# Patient Record
Sex: Male | Born: 1970 | Race: White | Hispanic: No | Marital: Married | Smoking: Current every day smoker
Health system: Southern US, Community
[De-identification: ages and names within clinical notes are randomized; demographics above are authoritative.]

## PROBLEM LIST (undated history)

## (undated) DIAGNOSIS — F32A Depression, unspecified: Secondary | ICD-10-CM

## (undated) DIAGNOSIS — F329 Major depressive disorder, single episode, unspecified: Secondary | ICD-10-CM

## (undated) DIAGNOSIS — I10 Essential (primary) hypertension: Secondary | ICD-10-CM

## (undated) DIAGNOSIS — F1910 Other psychoactive substance abuse, uncomplicated: Secondary | ICD-10-CM

## (undated) DIAGNOSIS — F419 Anxiety disorder, unspecified: Secondary | ICD-10-CM

## (undated) DIAGNOSIS — K7010 Alcoholic hepatitis without ascites: Secondary | ICD-10-CM

## (undated) DIAGNOSIS — E785 Hyperlipidemia, unspecified: Secondary | ICD-10-CM

## (undated) HISTORY — DX: Alcoholic hepatitis without ascites: K70.10

## (undated) HISTORY — DX: Depression, unspecified: F32.A

## (undated) HISTORY — DX: Hyperlipidemia, unspecified: E78.5

## (undated) HISTORY — DX: Other psychoactive substance abuse, uncomplicated: F19.10

## (undated) HISTORY — PX: OTHER SURGICAL HISTORY: SHX169

## (undated) HISTORY — PX: BACK SURGERY: SHX140

## (undated) HISTORY — DX: Major depressive disorder, single episode, unspecified: F32.9

## (undated) HISTORY — DX: Anxiety disorder, unspecified: F41.9

## (undated) HISTORY — PX: TONSILLECTOMY: SUR1361

---

## 2008-03-24 ENCOUNTER — Emergency Department (HOSPITAL_BASED_OUTPATIENT_CLINIC_OR_DEPARTMENT_OTHER): Admission: EM | Admit: 2008-03-24 | Discharge: 2008-03-24 | Payer: Self-pay | Admitting: Emergency Medicine

## 2008-07-24 ENCOUNTER — Encounter (INDEPENDENT_AMBULATORY_CARE_PROVIDER_SITE_OTHER): Payer: Self-pay | Admitting: *Deleted

## 2008-10-10 ENCOUNTER — Ambulatory Visit: Payer: Self-pay | Admitting: Internal Medicine

## 2008-10-10 DIAGNOSIS — F411 Generalized anxiety disorder: Secondary | ICD-10-CM | POA: Insufficient documentation

## 2008-10-10 DIAGNOSIS — F329 Major depressive disorder, single episode, unspecified: Secondary | ICD-10-CM | POA: Insufficient documentation

## 2008-10-10 DIAGNOSIS — F3289 Other specified depressive episodes: Secondary | ICD-10-CM | POA: Insufficient documentation

## 2008-10-14 ENCOUNTER — Telehealth: Payer: Self-pay | Admitting: Family Medicine

## 2008-10-16 ENCOUNTER — Encounter (INDEPENDENT_AMBULATORY_CARE_PROVIDER_SITE_OTHER): Payer: Self-pay | Admitting: *Deleted

## 2008-10-16 ENCOUNTER — Ambulatory Visit: Payer: Self-pay | Admitting: Internal Medicine

## 2008-10-16 ENCOUNTER — Telehealth (INDEPENDENT_AMBULATORY_CARE_PROVIDER_SITE_OTHER): Payer: Self-pay | Admitting: *Deleted

## 2008-10-16 LAB — CONVERTED CEMR LAB
Chloride: 106 meq/L (ref 96–112)
Eosinophils Relative: 3.9 % (ref 0.0–5.0)
GFR calc Af Amer: 140 mL/min
Glucose, Bld: 88 mg/dL (ref 70–99)
Lymphocytes Relative: 22.2 % (ref 12.0–46.0)
Monocytes Absolute: 0.7 10*3/uL (ref 0.1–1.0)
Monocytes Relative: 6.4 % (ref 3.0–12.0)
Neutrophils Relative %: 67.5 % (ref 43.0–77.0)
Platelets: 219 10*3/uL (ref 150–400)
Potassium: 5 meq/L (ref 3.5–5.1)
RDW: 13.2 % (ref 11.5–14.6)
Sodium: 142 meq/L (ref 135–145)
WBC: 10.2 10*3/uL (ref 4.5–10.5)

## 2008-11-13 ENCOUNTER — Telehealth (INDEPENDENT_AMBULATORY_CARE_PROVIDER_SITE_OTHER): Payer: Self-pay | Admitting: *Deleted

## 2008-11-23 ENCOUNTER — Ambulatory Visit: Payer: Self-pay | Admitting: Internal Medicine

## 2008-11-24 ENCOUNTER — Encounter: Payer: Self-pay | Admitting: Internal Medicine

## 2008-11-25 ENCOUNTER — Telehealth: Payer: Self-pay | Admitting: Internal Medicine

## 2008-11-25 LAB — CONVERTED CEMR LAB: Alcohol, Ethyl (B): 93 mg/dL — ABNORMAL HIGH (ref 0–10)

## 2011-07-17 LAB — DIFFERENTIAL
Basophils Absolute: 0.2 — ABNORMAL HIGH
Basophils Relative: 2 — ABNORMAL HIGH
Eosinophils Absolute: 0.1
Eosinophils Relative: 1
Lymphocytes Relative: 15
Lymphs Abs: 2
Monocytes Absolute: 1.2 — ABNORMAL HIGH
Monocytes Relative: 9
Neutro Abs: 9.8 — ABNORMAL HIGH
Neutrophils Relative %: 73

## 2011-07-17 LAB — BASIC METABOLIC PANEL WITH GFR
BUN: 9
CO2: 23
GFR calc non Af Amer: >60
Glucose, Bld: 108 — ABNORMAL HIGH
Potassium: 4.2

## 2011-07-17 LAB — CBC
HCT: 45.4
Hemoglobin: 16
MCHC: 35.2
MCV: 98.6
Platelets: 232
RBC: 4.6
RDW: 13.3
WBC: 13.3 — ABNORMAL HIGH

## 2011-07-17 LAB — BASIC METABOLIC PANEL
Calcium: 9.2
Chloride: 101
Creatinine, Ser: 0.8
GFR calc Af Amer: >60
Sodium: 136

## 2018-02-15 ENCOUNTER — Emergency Department (HOSPITAL_BASED_OUTPATIENT_CLINIC_OR_DEPARTMENT_OTHER)
Admission: EM | Admit: 2018-02-15 | Discharge: 2018-02-15 | Disposition: A | Discharge status: Home / Self Care | Payer: PRIVATE HEALTH INSURANCE | Attending: Emergency Medicine | Admitting: Emergency Medicine

## 2018-02-15 ENCOUNTER — Emergency Department (HOSPITAL_BASED_OUTPATIENT_CLINIC_OR_DEPARTMENT_OTHER): Payer: PRIVATE HEALTH INSURANCE

## 2018-02-15 ENCOUNTER — Other Ambulatory Visit: Payer: Self-pay

## 2018-02-15 ENCOUNTER — Encounter (HOSPITAL_BASED_OUTPATIENT_CLINIC_OR_DEPARTMENT_OTHER): Payer: Self-pay | Admitting: Emergency Medicine

## 2018-02-15 DIAGNOSIS — Y9389 Activity, other specified: Secondary | ICD-10-CM | POA: Insufficient documentation

## 2018-02-15 DIAGNOSIS — Y999 Unspecified external cause status: Secondary | ICD-10-CM | POA: Insufficient documentation

## 2018-02-15 DIAGNOSIS — Y92000 Kitchen of unspecified non-institutional (private) residence as  the place of occurrence of the external cause: Secondary | ICD-10-CM | POA: Insufficient documentation

## 2018-02-15 DIAGNOSIS — W01198A Fall on same level from slipping, tripping and stumbling with subsequent striking against other object, initial encounter: Secondary | ICD-10-CM | POA: Insufficient documentation

## 2018-02-15 DIAGNOSIS — S0101XA Laceration without foreign body of scalp, initial encounter: Secondary | ICD-10-CM | POA: Insufficient documentation

## 2018-02-15 DIAGNOSIS — I10 Essential (primary) hypertension: Secondary | ICD-10-CM | POA: Insufficient documentation

## 2018-02-15 HISTORY — DX: Essential (primary) hypertension: I10

## 2018-02-15 MED ORDER — TETANUS-DIPHTH-ACELL PERTUSSIS 5-2.5-18.5 LF-MCG/0.5 IM SUSP
0.5000 mL | Freq: Once | INTRAMUSCULAR | Status: AC
  Administered 2018-02-15: 0.5 mL via INTRAMUSCULAR
  Filled 2018-02-15: qty 0.5

## 2018-02-15 MED ORDER — LIDOCAINE-EPINEPHRINE 1 %-1:100000 IJ SOLN
INTRAMUSCULAR | Status: AC
  Administered 2018-02-15: 1 mL
  Filled 2018-02-15: qty 1

## 2018-02-15 MED ORDER — OXYCODONE-ACETAMINOPHEN 5-325 MG PO TABS
1.0000 | ORAL_TABLET | Freq: Once | ORAL | Status: AC
  Administered 2018-02-15: 1 via ORAL
  Filled 2018-02-15: qty 1

## 2018-02-15 NOTE — ED Provider Notes (Signed)
[facility] EMERGENCY DEPARTMENT Provider Note   CSN: [id number] Arrival date & time: 02/15/18  0449     History   Chief Complaint Chief Complaint  Patient presents with  . Laceration    HPI Javier Grimes is a 47 y.o. male.  HPI  This is a 47 year old male with a history of hypertension who presents with scalp laceration.  Patient reports that he got up this morning to go the kitchen.  He has had a recent history of back trouble and "twisted wrong" causing him to lose his balance and fell backwards hitting his head on the counter.  He did not lose consciousness.  He denies syncope.  He has been ambulatory.  Patient reports "a large amount of blood" all over the kitchen.  EMS was called and they bandaged his head.  Patient does report daily alcohol use.  States he last drank before going to bed.  Unknown last tetanus shot.  He is currently complaining of 6 out of 10 pain over the head.  Denies pain elsewhere including neck pain, shoulder pain, abdominal pain.  He does report ongoing back pain.  No weakness, numbness, tingling of the lower extremities.  Past Medical History:  Diagnosis Date  . Hypertension     Patient Active Problem List   Diagnosis Date Noted  . ANXIETY 10/10/2008  . DEPRESSION 10/10/2008    Past Surgical History:  Procedure Laterality Date  . BACK SURGERY          Home Medications    Prior to Admission medications   Medication Sig Start Date End Date Taking? Authorizing Provider  gabapentin (NEURONTIN) 600 MG tablet Take 600 mg by mouth 3 (three) times daily.   Yes [provider]    Family History No family history on file.  Social History Social History   Tobacco Use  . Smoking status: Current Every Day Smoker  . Smokeless tobacco: Never Used  Substance Use Topics  . Alcohol use: Yes  . Drug use: Not on file     Allergies   Penicillins   Review of Systems Review of Systems  Constitutional: Negative for  fever.  Respiratory: Negative for shortness of breath.   Cardiovascular: Negative for chest pain.  Gastrointestinal: Negative for nausea and vomiting.  Genitourinary: Negative for dysuria.  Musculoskeletal: Positive for back pain.  Skin: Positive for wound.  Neurological: Negative for seizures, syncope, weakness and numbness.  All other systems reviewed and are negative.    Physical Exam Updated Vital Signs BP (!) 136/92 (BP Location: Right Arm)   Pulse 81   Temp 97.9 F (36.6 C) (Oral)   Resp 16   Ht 6' (1.829 m)   Wt 91.2 kg (201 lb)   SpO2 92%   BMI 27.26 kg/m   Physical Exam  Constitutional: He is oriented to person, place, and time. He appears well-developed and well-nourished.  ABCs intact  HENT:  Head: Normocephalic. Head is with laceration. Head is without raccoon's eyes and without Battle's sign.    6 cm gaping laceration over the left occiput, bleeding noted, small underlying hematoma, no palpable fracture  Eyes: Pupils are equal, round, and reactive to light.  Neck: Normal range of motion. Neck supple.  No midline C-spine tenderness palpation  Cardiovascular: Normal rate and regular rhythm.  Pulmonary/Chest: Effort normal. No respiratory distress.  Musculoskeletal: He exhibits no edema.  Neurological: He is alert and oriented to person, place, and time.  Skin: Skin is warm and dry.  Psychiatric: He has a normal mood and affect.  Nursing note and vitals reviewed.    ED Treatments / Results  Labs (all labs ordered are listed, but only abnormal results are displayed) Labs Reviewed - No data to display  EKG None  Radiology Ct Head Wo Contrast  Result Date: 02/15/2018 CLINICAL DATA:  Patient fell striking posterior right side of the head. 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 EXAM: CT HEAD WITHOUT CONTRAST TECHNIQUE: Contiguous axial images were obtained from the base of the skull through the vertex without intravenous contrast. COMPARISON:  None.  FINDINGS: Brain: Diffuse cerebral atrophy. No ventricular dilatation. No mass effect or midline shift. No abnormal extra-axial fluid collections. Gray-white matter junctions are distinct. Basal cisterns are not effaced. No acute intracranial hemorrhage. Vascular: No hyperdense vessel or unexpected calcification. Skull: Calvarium appears intact. No acute depressed skull fractures. Sinuses/Orbits: Opacification of the right maxillary antrum likely due to a large retention cyst. Mild mucosal thickening in the paranasal sinuses. No acute air-fluid levels. Mastoid air cells are clear. Other: Small subcutaneous soft tissue hematoma and subcutaneous gas over the right posterior parietal region. Skin clips are present. IMPRESSION: No acute intracranial abnormalities. Diffuse cerebral atrophy. Soft tissue hematoma and laceration over the right posterior parietal scalp. Electronically Signed   By: Burman Nieves M.D.   On: 02/15/2018 06:34    Procedures .Marland KitchenLaceration Repair Date/Time: 02/15/2018 6:00 AM Performed by: Shon Baton, MD Authorized by: Shon Baton, MD   Consent:    Consent obtained:  Verbal   Consent given by:  Patient   Risks discussed:  Pain and need for additional repair   Alternatives discussed:  No treatment Anesthesia (see MAR for exact dosages):    Anesthesia method:  Local infiltration   Local anesthetic:  Lidocaine 2% WITH epi Laceration details:    Location:  Scalp   Scalp location:  Occipital   Length (cm):  6   Depth (mm):  5 Repair type:    Repair type:  Simple Pre-procedure details:    Preparation:  Patient was prepped and draped in usual sterile fashion Exploration:    Wound exploration: wound explored through full range of motion     Wound extent: no foreign bodies/material noted and no muscle damage noted     Contaminated: no   Treatment:    Area cleansed with:  Betadine and saline   Amount of cleaning:  Standard   Irrigation solution:  Sterile  saline   Irrigation method:  Pressure wash   Visualized foreign bodies/material removed: no   Skin repair:    Repair method:  Staples   Number of staples:  5 Approximation:    Approximation:  Close Post-procedure details:    Dressing:  Open (no dressing)   Patient tolerance of procedure:  Tolerated well, no immediate complications   (including critical care time)  Medications Ordered in ED Medications  oxyCODONE-acetaminophen (PERCOCET/ROXICET) 5-325 MG per tablet 1 tablet (1 tablet Oral Given 02/15/18 0528)  Tdap (BOOSTRIX) injection 0.5 mL (0.5 mLs Intramuscular Given 02/15/18 0548)  lidocaine-EPINEPHrine (XYLOCAINE W/EPI) 1 %-1:100000 (with pres) injection (1 mL  Given by Other 02/15/18 0547)     Initial Impression / Assessment and Plan / ED Course  I have reviewed the triage vital signs and the nursing notes.  Pertinent labs & imaging results that were available during my care of the patient were reviewed by me and considered in my medical decision making (see chart for details).     She  presents with a laceration to the scalp.  Reports mechanical fall.  No loss of consciousness.  Extensive bleeding noted with matted hair.  However, revealed 6 cm laceration to the scalp.  Suspect alcohol may play a role in the fall.  CT scan obtained and shows no evidence of intracranial bleed.  Laceration was repaired at the bedside.  No other obvious injury.  Tetanus was updated.  Recommend suture removal in 7 to 10 days with  After history, exam, and medical workup I feel the patient has been appropriately medically screened and is safe for discharge home. Pertinent diagnoses were discussed with the patient. Patient was given return precautions.   Final Clinical Impressions(s) / ED Diagnoses   Final diagnoses:  Laceration of scalp, initial encounter    ED Discharge Orders    None       Shon Baton, MD 02/15/18 [phone]

## 2018-02-15 NOTE — Discharge Instructions (Addendum)
You were seen today and have a head injury.  He received staples for a scalp laceration.  You need to have your staples removed in 7 to 10 days.

## 2018-02-15 NOTE — ED Notes (Signed)
ED Provider at bedside. 

## 2018-02-15 NOTE — ED Notes (Signed)
Patient returned from CT

## 2018-02-15 NOTE — ED Triage Notes (Signed)
Patient states he fell in the kitchen and hit his head on the counter; denies LOC; states EMS on the scene but did not transport. No acute distress noted.

## 2018-04-29 ENCOUNTER — Other Ambulatory Visit: Payer: Self-pay

## 2018-04-29 ENCOUNTER — Emergency Department (HOSPITAL_COMMUNITY): Payer: PRIVATE HEALTH INSURANCE

## 2018-04-29 ENCOUNTER — Encounter (HOSPITAL_COMMUNITY): Payer: Self-pay | Admitting: *Deleted

## 2018-04-29 ENCOUNTER — Inpatient Hospital Stay (HOSPITAL_COMMUNITY)
Admission: EM | Admit: 2018-04-29 | Discharge: 2018-05-06 | DRG: 433 | Disposition: A | Discharge status: Home Health | Payer: PRIVATE HEALTH INSURANCE | Attending: Internal Medicine | Admitting: Internal Medicine

## 2018-04-29 DIAGNOSIS — K766 Portal hypertension: Secondary | ICD-10-CM | POA: Diagnosis present

## 2018-04-29 DIAGNOSIS — R509 Fever, unspecified: Secondary | ICD-10-CM

## 2018-04-29 DIAGNOSIS — B179 Acute viral hepatitis, unspecified: Secondary | ICD-10-CM | POA: Diagnosis present

## 2018-04-29 DIAGNOSIS — K7682 Hepatic encephalopathy: Secondary | ICD-10-CM | POA: Clinically undetermined

## 2018-04-29 DIAGNOSIS — E538 Deficiency of other specified B group vitamins: Secondary | ICD-10-CM | POA: Diagnosis present

## 2018-04-29 DIAGNOSIS — R188 Other ascites: Secondary | ICD-10-CM

## 2018-04-29 DIAGNOSIS — K7010 Alcoholic hepatitis without ascites: Secondary | ICD-10-CM | POA: Diagnosis present

## 2018-04-29 DIAGNOSIS — D509 Iron deficiency anemia, unspecified: Secondary | ICD-10-CM | POA: Diagnosis present

## 2018-04-29 DIAGNOSIS — R17 Unspecified jaundice: Secondary | ICD-10-CM | POA: Diagnosis present

## 2018-04-29 DIAGNOSIS — E440 Moderate protein-calorie malnutrition: Secondary | ICD-10-CM | POA: Diagnosis present

## 2018-04-29 DIAGNOSIS — R52 Pain, unspecified: Secondary | ICD-10-CM

## 2018-04-29 DIAGNOSIS — F1010 Alcohol abuse, uncomplicated: Secondary | ICD-10-CM | POA: Diagnosis present

## 2018-04-29 DIAGNOSIS — Z6823 Body mass index (BMI) 23.0-23.9, adult: Secondary | ICD-10-CM

## 2018-04-29 DIAGNOSIS — M545 Low back pain, unspecified: Secondary | ICD-10-CM

## 2018-04-29 DIAGNOSIS — G8929 Other chronic pain: Secondary | ICD-10-CM | POA: Diagnosis present

## 2018-04-29 DIAGNOSIS — K7031 Alcoholic cirrhosis of liver with ascites: Secondary | ICD-10-CM | POA: Diagnosis present

## 2018-04-29 DIAGNOSIS — E876 Hypokalemia: Secondary | ICD-10-CM | POA: Diagnosis present

## 2018-04-29 DIAGNOSIS — D539 Nutritional anemia, unspecified: Secondary | ICD-10-CM | POA: Diagnosis present

## 2018-04-29 DIAGNOSIS — K7200 Acute and subacute hepatic failure without coma: Secondary | ICD-10-CM

## 2018-04-29 DIAGNOSIS — F17210 Nicotine dependence, cigarettes, uncomplicated: Secondary | ICD-10-CM | POA: Diagnosis present

## 2018-04-29 DIAGNOSIS — R7401 Elevation of levels of liver transaminase levels: Secondary | ICD-10-CM | POA: Diagnosis present

## 2018-04-29 DIAGNOSIS — I10 Essential (primary) hypertension: Secondary | ICD-10-CM | POA: Diagnosis present

## 2018-04-29 DIAGNOSIS — F10939 Alcohol use, unspecified with withdrawal, unspecified: Secondary | ICD-10-CM | POA: Diagnosis present

## 2018-04-29 DIAGNOSIS — R197 Diarrhea, unspecified: Secondary | ICD-10-CM | POA: Diagnosis not present

## 2018-04-29 DIAGNOSIS — K7040 Alcoholic hepatic failure without coma: Principal | ICD-10-CM | POA: Diagnosis present

## 2018-04-29 DIAGNOSIS — F10239 Alcohol dependence with withdrawal, unspecified: Secondary | ICD-10-CM | POA: Diagnosis present

## 2018-04-29 DIAGNOSIS — K7011 Alcoholic hepatitis with ascites: Secondary | ICD-10-CM | POA: Diagnosis present

## 2018-04-29 DIAGNOSIS — R740 Nonspecific elevation of levels of transaminase and lactic acid dehydrogenase [LDH]: Secondary | ICD-10-CM

## 2018-04-29 LAB — CBC
HEMATOCRIT: 34.9 % — AB (ref 39.0–52.0)
Hemoglobin: 12.3 g/dL — ABNORMAL LOW (ref 13.0–17.0)
MCH: 40.5 pg — ABNORMAL HIGH (ref 26.0–34.0)
MCHC: 35.2 g/dL (ref 30.0–36.0)
MCV: 114.8 fL — ABNORMAL HIGH (ref 78.0–100.0)
Platelets: 197 10*3/uL (ref 150–400)
RBC: 3.04 MIL/uL — ABNORMAL LOW (ref 4.22–5.81)
RDW: 18.6 % — AB (ref 11.5–15.5)
WBC: 8.7 10*3/uL (ref 4.0–10.5)

## 2018-04-29 LAB — COMPREHENSIVE METABOLIC PANEL
ALT: 66 U/L — ABNORMAL HIGH (ref 0–44)
AST: 438 U/L — AB (ref 15–41)
Albumin: 1.9 g/dL — ABNORMAL LOW (ref 3.5–5.0)
Alkaline Phosphatase: 412 U/L — ABNORMAL HIGH (ref 38–126)
Anion gap: 10 (ref 5–15)
BILIRUBIN TOTAL: 19.8 mg/dL — AB (ref 0.3–1.2)
BUN: 8 mg/dL (ref 6–20)
CALCIUM: 7.8 mg/dL — AB (ref 8.9–10.3)
CO2: 24 mmol/L (ref 22–32)
Chloride: 105 mmol/L (ref 98–111)
Creatinine, Ser: <0.3 mg/dL — ABNORMAL LOW (ref 0.61–1.24)
Glucose, Bld: 108 mg/dL — ABNORMAL HIGH (ref 70–99)
POTASSIUM: 3.3 mmol/L — AB (ref 3.5–5.1)
Sodium: 139 mmol/L (ref 135–145)
TOTAL PROTEIN: 5.9 g/dL — AB (ref 6.5–8.1)

## 2018-04-29 LAB — URINALYSIS, ROUTINE W REFLEX MICROSCOPIC
Glucose, UA: NEGATIVE mg/dL
Hgb urine dipstick: NEGATIVE
KETONES UR: NEGATIVE mg/dL
LEUKOCYTES UA: NEGATIVE
NITRITE: NEGATIVE
PH: 6 (ref 5.0–8.0)
Protein, ur: NEGATIVE mg/dL
Specific Gravity, Urine: 1.027 (ref 1.005–1.030)

## 2018-04-29 LAB — ACETAMINOPHEN LEVEL

## 2018-04-29 LAB — PROTIME-INR
INR: 1.13
Prothrombin Time: 14.5 seconds (ref 11.4–15.2)

## 2018-04-29 LAB — ETHANOL: Alcohol, Ethyl (B): 204 mg/dL — ABNORMAL HIGH (ref <10)

## 2018-04-29 LAB — LIPASE, BLOOD: Lipase: 76 U/L — ABNORMAL HIGH (ref 11–51)

## 2018-04-29 MED ORDER — IOPAMIDOL (ISOVUE-300) INJECTION 61%
INTRAVENOUS | Status: AC
  Filled 2018-04-29: qty 100

## 2018-04-29 MED ORDER — SODIUM CHLORIDE 0.9 % IV BOLUS (SEPSIS)
1000.0000 mL | Freq: Once | INTRAVENOUS | Status: AC
  Administered 2018-04-29: 1000 mL via INTRAVENOUS

## 2018-04-29 MED ORDER — LORAZEPAM 2 MG/ML IJ SOLN
INTRAMUSCULAR | Status: AC
  Administered 2018-04-30: 2 mg via INTRAVENOUS
  Filled 2018-04-29: qty 1

## 2018-04-29 MED ORDER — IOPAMIDOL (ISOVUE-300) INJECTION 61%
100.0000 mL | Freq: Once | INTRAVENOUS | Status: AC | PRN
  Administered 2018-04-29: 100 mL via INTRAVENOUS

## 2018-04-29 MED ORDER — LORAZEPAM 2 MG/ML IJ SOLN
2.0000 mg | INTRAMUSCULAR | Status: DC | PRN
  Administered 2018-04-29 – 2018-05-01 (×6): 2 mg via INTRAVENOUS
  Filled 2018-04-29 (×6): qty 1

## 2018-04-29 MED ORDER — ADULT MULTIVITAMIN W/MINERALS CH
1.0000 | ORAL_TABLET | Freq: Once | ORAL | Status: DC
  Filled 2018-04-29: qty 1

## 2018-04-29 MED ORDER — VITAMIN B-1 100 MG PO TABS
100.0000 mg | ORAL_TABLET | Freq: Once | ORAL | Status: DC
  Filled 2018-04-29: qty 1

## 2018-04-29 MED ORDER — SODIUM CHLORIDE 0.9 % IV SOLN
1000.0000 mL | INTRAVENOUS | Status: DC
  Administered 2018-04-29: 1000 mL via INTRAVENOUS

## 2018-04-29 NOTE — ED Notes (Signed)
CRITICAL VALUE STICKER  CRITICAL VALUE:  RECEIVER (on-site recipient of call): Lisette Mancebo  DATE & TIME NOTIFIED: 04/29/18 @ 19:58  MESSENGER (representative from lab): [place]  MD NOTIFIED: Dr. Penne LashIssacs  TIME OF NOTIFICATION: 20:00  RESPONSE: No new orders

## 2018-04-29 NOTE — ED Notes (Signed)
Bed: [id number] Expected date:  Expected time:  Means of arrival:  Comments: Critical lab from lobby

## 2018-04-29 NOTE — ED Provider Notes (Signed)
[facility]-EMERGENCY DEPT Provider Note   CSN: [id number] Arrival date & time: 04/29/18  1850     History   Chief Complaint Chief Complaint  Patient presents with  . Diarrhea  . Back Pain    HPI Javier Grimes is a 47 y.o. male.  HPI 47 year old male with a history of alcohol abuse who presents the emergency department with 1 week of diarrhea and development of scleral icterus.  He is a heavy drinker.  He also reports new swelling in his legs with pain in his ankles and knees.  He reports some low back pain over the past month as well.  He denies blood in his stool.  Reports his stools watery.  No recent antibiotics.  No new over-the-counter medications.  Denies excessive Tylenol use.   Past Medical History:  Diagnosis Date  . Hypertension     Patient Active Problem List   Diagnosis Date Noted  . Jaundice 04/29/2018  . ANXIETY 10/10/2008  . DEPRESSION 10/10/2008    Past Surgical History:  Procedure Laterality Date  . BACK SURGERY          Home Medications    Prior to Admission medications   Medication Sig Start Date End Date Taking? Authorizing Provider  bismuth subsalicylate (PEPTO BISMOL) 262 MG/15ML suspension Take 30 mLs by mouth every 6 (six) hours as needed for indigestion.   Yes [provider]    Family History No family history on file.  Social History Social History   Tobacco Use  . Smoking status: Current Every Day Smoker    Packs/day: 1.00    Types: Cigarettes  . Smokeless tobacco: Current User    Types: Chew  Substance Use Topics  . Alcohol use: Yes  . Drug use: Never     Allergies   Penicillins   Review of Systems Review of Systems  All other systems reviewed and are negative.    Physical Exam Updated Vital Signs BP 112/77 (BP Location: Left Arm)   Pulse 86   Temp 97.7 F (36.5 C) (Oral)   Resp 16   Ht 6' (1.829 m)   Wt 77.1 kg (170 lb)   SpO2 99%   BMI 23.06 kg/m   Physical Exam    Constitutional: He is oriented to person, place, and time. He appears well-developed and well-nourished.  HENT:  Head: Normocephalic and atraumatic.  Eyes: EOM are normal.  Scleral icterus  Neck: Normal range of motion. Neck supple.  Cardiovascular: Normal rate, regular rhythm and normal heart sounds.  Pulmonary/Chest: Effort normal and breath sounds normal. No respiratory distress.  Abdominal: Soft. He exhibits no distension. There is no tenderness.  Musculoskeletal: He exhibits edema and tenderness.  Lower extremity edema bilaterally.  Normal pulses bilaterally.  Neurological: He is alert and oriented to person, place, and time.  Skin: Skin is warm and dry.  Jaundice  Psychiatric: He has a normal mood and affect. Judgment normal.  Nursing note and vitals reviewed.    ED Treatments / Results  Labs (all labs ordered are listed, but only abnormal results are displayed) Labs Reviewed  LIPASE, BLOOD - Abnormal; Notable for the following components:      Result Value   Lipase 76 (*)    All other components within normal limits  COMPREHENSIVE METABOLIC PANEL - Abnormal; Notable for the following components:   Potassium 3.3 (*)    Glucose, Bld 108 (*)    Creatinine, Ser <0.30 (*)    Calcium 7.8 (*)  Total Protein 5.9 (*)    Albumin 1.9 (*)    AST 438 (*)    ALT 66 (*)    Alkaline Phosphatase 412 (*)    Total Bilirubin 19.8 (*)    All other components within normal limits  CBC - Abnormal; Notable for the following components:   RBC 3.04 (*)    Hemoglobin 12.3 (*)    HCT 34.9 (*)    MCV 114.8 (*)    MCH 40.5 (*)    RDW 18.6 (*)    All other components within normal limits  ACETAMINOPHEN LEVEL - Abnormal; Notable for the following components:   Acetaminophen (Tylenol), Serum <10 (*)    All other components within normal limits  ETHANOL - Abnormal; Notable for the following components:   Alcohol, Ethyl (B) 204 (*)    All other components within normal limits  C  DIFFICILE QUICK SCREEN W PCR REFLEX  PROTIME-INR  URINALYSIS, ROUTINE W REFLEX MICROSCOPIC  HEPATITIS PANEL, ACUTE    EKG None  Radiology Ct Abdomen Pelvis W Contrast  Result Date: 04/29/2018 CLINICAL DATA:  Bilateral lower extremity swelling for a week. Diarrhea for 1 week. Back pain for 1 month. Painless jaundice. EXAM: CT ABDOMEN AND PELVIS WITH CONTRAST TECHNIQUE: Multidetector CT imaging of the abdomen and pelvis was performed using the standard protocol following bolus administration of intravenous contrast. CONTRAST:  100mL ISOVUE-300 IOPAMIDOL (ISOVUE-300) INJECTION 61% COMPARISON:  CT abdomen and pelvis February 11, 2015 FINDINGS: LOWER CHEST: Bilateral lower lobe atelectasis. Included heart size is normal. No pericardial effusion. HEPATOBILIARY: Liver is diffusely severely hypodense, 25 cm in craniocaudad dimension. Patent main portal vein. Focal fatty sparing about the gallbladder fossa. Perihepatic fat stranding. Normal gallbladder. PANCREAS: Mild peripancreatic fat stranding about the head and uncinate process. No focal necrosis, ductal dilatation, calcifications or mass. SPLEEN: Normal. ADRENALS/URINARY TRACT: Kidneys are orthotopic, demonstrating symmetric enhancement. No nephrolithiasis, hydronephrosis or solid renal masses. Too small to characterize hypodensities bilateral kidneys. The unopacified ureters are normal in course and caliber. Delayed imaging through the kidneys demonstrates symmetric prompt contrast excretion within the proximal urinary collecting system. Urinary bladder is adequately distended and unremarkable. Normal adrenal glands. STOMACH/BOWEL: The stomach, small and large bowel are normal in course and caliber without inflammatory changes. Normal appendix. VASCULAR/LYMPHATIC: Aortoiliac vessels are normal in course and caliber. Mild calcific atherosclerosis. Small reactive portacaval lymph nodes. REPRODUCTIVE: Normal. OTHER: Small to moderate volume ascites. No  intraperitoneal free air or focal fluid collections. MUSCULOSKELETAL: Nonacute. Small fat containing inguinal hernias. Moderate L5-S1 degenerative disc and probable disc extrusion which may affect the traversing LEFT S1 nerve (series 2, image 60). IMPRESSION: 1. Hepatomegaly, markedly hypodense liver seen with hepatitis or severe steatosis. Recommend correlation with liver function tests. 2. Mild peripancreatic inflammation may be reactive or reflect primary pancreatitis. No necrosis. 3. Small to moderate volume ascites. 4. L4-5 probable disc extrusion which may affect the traversing LEFT L5 nerve. Aortic Atherosclerosis (ICD10-I70.0). Electronically Signed   By: Awilda Metroourtnay  Bloomer M.D.   On: 04/29/2018 22:28    Procedures .Critical Care Performed by: Azalia Bilisampos, Shatarra Wehling, MD Authorized by: Azalia Bilisampos, Asani Mcburney, MD     CRITICAL CARE Performed by: Azalia BilisKevin Jalayah Gutridge Total critical care time: 31 minutes Critical care time was exclusive of separately billable procedures and treating other patients. Critical care was necessary to treat or prevent imminent or life-threatening deterioration. Critical care was time spent personally by me on the following activities: development of treatment plan with patient and/or surrogate as well as nursing, discussions  with consultants, evaluation of patient's response to treatment, examination of patient, obtaining history from patient or surrogate, ordering and performing treatments and interventions, ordering and review of laboratory studies, ordering and review of radiographic studies, pulse oximetry and re-evaluation of patient's condition.   Medications Ordered in ED Medications  sodium chloride 0.9 % bolus 1,000 mL (1,000 mLs Intravenous New Bag/Given 04/29/18 2145)    Followed by  0.9 %  sodium chloride infusion (1,000 mLs Intravenous New Bag/Given 04/29/18 2153)  thiamine (VITAMIN B-1) tablet 100 mg (has no administration in time range)  multivitamin with minerals tablet 1  tablet (has no administration in time range)  iopamidol (ISOVUE-300) 61 % injection (has no administration in time range)  iopamidol (ISOVUE-300) 61 % injection 100 mL (100 mLs Intravenous Contrast Given 04/29/18 2159)     Initial Impression / Assessment and Plan / ED Course  I have reviewed the triage vital signs and the nursing notes.  Pertinent labs & imaging results that were available during my care of the patient were reviewed by me and considered in my medical decision making (see chart for details).     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  Acute hepatitis panel pending.  INR 1.13.  Meld 19.  Tylenol level pending.  Clinically he appears intravascularly depleted given his profound diarrhea.  IV fluids now.  He will need significant work-up in the hospital likely GI consultation.   Final Clinical Impressions(s) / ED Diagnoses   Final diagnoses:  Acute liver failure without hepatic coma  Hyperbilirubinemia  Alcohol abuse  Diarrhea, unspecified type    ED Discharge Orders    None       Azalia Bilis, MD 04/29/18 2311

## 2018-04-29 NOTE — ED Notes (Signed)
Pt is aware of urine sample needed 

## 2018-04-29 NOTE — ED Triage Notes (Signed)
Pt presents with bilateral swelling in knees and ankles x 1 week.  Pt also reports back pain x 1 month.  Pt reports diarrhea x 1 week. Pt hasn't been able tolerate PO x 1.5 weeks. Pt a/o x 4 and ambulatory.

## 2018-04-30 ENCOUNTER — Inpatient Hospital Stay (HOSPITAL_COMMUNITY): Payer: PRIVATE HEALTH INSURANCE

## 2018-04-30 DIAGNOSIS — K7200 Acute and subacute hepatic failure without coma: Secondary | ICD-10-CM

## 2018-04-30 DIAGNOSIS — D509 Iron deficiency anemia, unspecified: Secondary | ICD-10-CM

## 2018-04-30 DIAGNOSIS — R7401 Elevation of levels of liver transaminase levels: Secondary | ICD-10-CM | POA: Diagnosis present

## 2018-04-30 DIAGNOSIS — F10239 Alcohol dependence with withdrawal, unspecified: Secondary | ICD-10-CM

## 2018-04-30 DIAGNOSIS — R197 Diarrhea, unspecified: Secondary | ICD-10-CM

## 2018-04-30 DIAGNOSIS — R740 Nonspecific elevation of levels of transaminase and lactic acid dehydrogenase [LDH]: Secondary | ICD-10-CM | POA: Diagnosis not present

## 2018-04-30 DIAGNOSIS — R933 Abnormal findings on diagnostic imaging of other parts of digestive tract: Secondary | ICD-10-CM | POA: Diagnosis not present

## 2018-04-30 DIAGNOSIS — F1010 Alcohol abuse, uncomplicated: Secondary | ICD-10-CM | POA: Diagnosis not present

## 2018-04-30 DIAGNOSIS — R188 Other ascites: Secondary | ICD-10-CM

## 2018-04-30 DIAGNOSIS — R17 Unspecified jaundice: Secondary | ICD-10-CM

## 2018-04-30 LAB — CBC WITH DIFFERENTIAL/PLATELET
BASOS PCT: 1 %
Basophils Absolute: 0 10*3/uL (ref 0.0–0.1)
EOS ABS: 0.1 10*3/uL (ref 0.0–0.7)
Eosinophils Relative: 2 %
HCT: 30.4 % — ABNORMAL LOW (ref 39.0–52.0)
Hemoglobin: 10.5 g/dL — ABNORMAL LOW (ref 13.0–17.0)
LYMPHS ABS: 1.4 10*3/uL (ref 0.7–4.0)
Lymphocytes Relative: 19 %
MCH: 40.1 pg — ABNORMAL HIGH (ref 26.0–34.0)
MCHC: 34.5 g/dL (ref 30.0–36.0)
MCV: 116 fL — AB (ref 78.0–100.0)
Monocytes Absolute: 0.6 10*3/uL (ref 0.1–1.0)
Monocytes Relative: 8 %
Neutro Abs: 5.1 10*3/uL (ref 1.7–7.7)
Neutrophils Relative %: 70 %
PLATELETS: 200 10*3/uL (ref 150–400)
RBC: 2.62 MIL/uL — ABNORMAL LOW (ref 4.22–5.81)
RDW: 18.9 % — ABNORMAL HIGH (ref 11.5–15.5)
WBC: 7.2 10*3/uL (ref 4.0–10.5)

## 2018-04-30 LAB — HEPATIC FUNCTION PANEL
ALBUMIN: 1.7 g/dL — AB (ref 3.5–5.0)
ALT: 60 U/L — ABNORMAL HIGH (ref 0–44)
AST: 389 U/L — ABNORMAL HIGH (ref 15–41)
Alkaline Phosphatase: 348 U/L — ABNORMAL HIGH (ref 38–126)
BILIRUBIN TOTAL: 17 mg/dL — AB (ref 0.3–1.2)
Bilirubin, Direct: 9.3 mg/dL — ABNORMAL HIGH (ref 0.0–0.2)
Indirect Bilirubin: 7.7 mg/dL — ABNORMAL HIGH (ref 0.3–0.9)
Total Protein: 5.1 g/dL — ABNORMAL LOW (ref 6.5–8.1)

## 2018-04-30 LAB — BASIC METABOLIC PANEL
Anion gap: 8 (ref 5–15)
BUN: 7 mg/dL (ref 6–20)
CO2: 24 mmol/L (ref 22–32)
Calcium: 7.1 mg/dL — ABNORMAL LOW (ref 8.9–10.3)
Chloride: 107 mmol/L (ref 98–111)
Creatinine, Ser: <0.3 mg/dL — ABNORMAL LOW (ref 0.61–1.24)
Glucose, Bld: 89 mg/dL (ref 70–99)
Potassium: 3 mmol/L — ABNORMAL LOW (ref 3.5–5.1)
SODIUM: 139 mmol/L (ref 135–145)

## 2018-04-30 LAB — BODY FLUID CELL COUNT WITH DIFFERENTIAL
Eos, Fluid: 0 %
LYMPHS FL: 30 %
Monocyte-Macrophage-Serous Fluid: 64 % (ref 50–90)
NEUTROPHIL FLUID: 6 % (ref 0–25)
WBC FLUID: 60 uL (ref 0–1000)

## 2018-04-30 LAB — IRON AND TIBC
Iron: 141 ug/dL (ref 45–182)
Saturation Ratios: 101 % — ABNORMAL HIGH (ref 17.9–39.5)
TIBC: 140 ug/dL — ABNORMAL LOW (ref 250–450)

## 2018-04-30 LAB — FERRITIN: Ferritin: 1693 ng/mL — ABNORMAL HIGH (ref 24–336)

## 2018-04-30 LAB — PROTIME-INR
INR: 1.21
PROTHROMBIN TIME: 15.2 s (ref 11.4–15.2)

## 2018-04-30 LAB — ALBUMIN, PLEURAL OR PERITONEAL FLUID

## 2018-04-30 LAB — GLUCOSE, PLEURAL OR PERITONEAL FLUID: Glucose, Fluid: 89 mg/dL

## 2018-04-30 LAB — PROTEIN, PLEURAL OR PERITONEAL FLUID

## 2018-04-30 LAB — VITAMIN B12: VITAMIN B 12: 1385 pg/mL — AB (ref 180–914)

## 2018-04-30 LAB — MRSA PCR SCREENING: MRSA BY PCR: NEGATIVE

## 2018-04-30 LAB — HIV ANTIBODY (ROUTINE TESTING W REFLEX): HIV Screen 4th Generation wRfx: NONREACTIVE

## 2018-04-30 LAB — RETICULOCYTES
RBC.: 2.69 MIL/uL — AB (ref 4.22–5.81)
RETIC COUNT ABSOLUTE: 131.8 10*3/uL (ref 19.0–186.0)
RETIC CT PCT: 4.9 % — AB (ref 0.4–3.1)

## 2018-04-30 LAB — FOLATE: Folate: 3.2 ng/mL — ABNORMAL LOW (ref >5.9)

## 2018-04-30 LAB — MAGNESIUM: MAGNESIUM: 1.7 mg/dL (ref 1.7–2.4)

## 2018-04-30 MED ORDER — PANTOPRAZOLE SODIUM 40 MG PO TBEC: 40.0000 mg | DELAYED_RELEASE_TABLET | Freq: Every day | ORAL | Status: DC

## 2018-04-30 MED ORDER — PANTOPRAZOLE SODIUM 40 MG PO TBEC
40.0000 mg | DELAYED_RELEASE_TABLET | Freq: Every day | ORAL | Status: DC
  Administered 2018-04-30 – 2018-05-06 (×7): 40 mg via ORAL
  Filled 2018-04-30 (×7): qty 1

## 2018-04-30 MED ORDER — POTASSIUM CHLORIDE CRYS ER 20 MEQ PO TBCR
40.0000 meq | EXTENDED_RELEASE_TABLET | ORAL | Status: AC
  Administered 2018-04-30 (×2): 40 meq via ORAL
  Filled 2018-04-30 (×2): qty 2

## 2018-04-30 MED ORDER — ONDANSETRON HCL 4 MG PO TABS: 4.0000 mg | ORAL_TABLET | Freq: Four times a day (QID) | ORAL | Status: DC | PRN

## 2018-04-30 MED ORDER — FOLIC ACID 1 MG PO TABS
1.0000 mg | ORAL_TABLET | Freq: Every day | ORAL | Status: DC
  Administered 2018-04-30 – 2018-05-06 (×7): 1 mg via ORAL
  Filled 2018-04-30 (×7): qty 1

## 2018-04-30 MED ORDER — SODIUM CHLORIDE 0.9 % IV SOLN
INTRAVENOUS | Status: DC
  Administered 2018-04-30: 01:00:00 via INTRAVENOUS

## 2018-04-30 MED ORDER — ONDANSETRON HCL 4 MG/2ML IJ SOLN: 4.0000 mg | Freq: Four times a day (QID) | INTRAMUSCULAR | Status: DC | PRN

## 2018-04-30 MED ORDER — MAGNESIUM SULFATE 4 GM/100ML IV SOLN
4.0000 g | Freq: Once | INTRAVENOUS | Status: AC
  Administered 2018-04-30: 4 g via INTRAVENOUS
  Filled 2018-04-30: qty 100

## 2018-04-30 MED ORDER — LIDOCAINE HCL 1 % IJ SOLN
INTRAMUSCULAR | Status: AC
  Filled 2018-04-30: qty 10

## 2018-04-30 MED ORDER — LEVOFLOXACIN IN D5W 750 MG/150ML IV SOLN
750.0000 mg | INTRAVENOUS | Status: DC
  Administered 2018-04-30 – 2018-05-01 (×2): 750 mg via INTRAVENOUS
  Filled 2018-04-30 (×2): qty 150

## 2018-04-30 NOTE — Procedures (Signed)
Ultrasound-guided diagnostic and therapeutic paracentesis performed yielding 280 cc of golden yellow fluid. No immediate complications.  The fluid was submitted to the lab for preordered studies.  Only a small amount of ascites was present on today's exam.

## 2018-04-30 NOTE — Consult Note (Addendum)
Referring Provider: [facility]   Primary Care Physician:  Patient, No Pcp Per Primary Gastroenterologist:  None. Unassigned  Reason for Consultation:   Diarrhea, elevated bilirubin    ASSESSMENT AND PLAN:    74. 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  MDF 18 -Alk phos ~ 400, tbili of 19. AST to ALT ratio c/w ETOH.  -Ascites - can be present in severe acute ETOH hepatitis without underlying cirrhosis and liver doesn't appear cirrhotic on imaging. Diagnostic paracentesis with fluid studies already ordered  -MDF < 32,  steroids wouldn't be beneficial.   -am INR, liver chemistries -check viral hepatitis studies (pending) -We discussed his ETOH abuse and prognosis if continues to drink like this. He is interested in rehab.   2. Possible acute mild pancreatitis by CT scan. Lipase 76. He hasn't had any abdominal pain.  -Okay to eat unless causes abdominal pain  3. Mild macrocytic anemia, likely related to ETOH / folate deficiency.   -replete folate which is low at 3.2.  -B12 normal.  4. Markedly elevated ferritin, likely reactive and also secondary to ETOH.  -Will need to be followed and HH rule out .   5. Diarrhea. Present for a week prior to admission. No recent antibiotics. No PD dilation or calcifications on imaging raising concern for chronic pancreatitis.  -Diarrhea has resolved. Stools solid per RN   HPI: Javier Grimes is a 47 y.o. male with a history of ETOH abuse, tobacco abuse, chronic back pain. He came to ED yesterday with jaundice and lower extremity swelling.  He was tremulous from ETOH w/d. Onofrio drinks liquor everyday and has done so for years. He was sober at one point after going through rehab. Patient recently noticed swelling in legs. He also noticed eyes were yellow and urine dark. He hadn't had any abdominal pain but complains of feeling bloated.  Several days ago he began having several loose stools a day with  associated scant painless rectal bleeding. He has been taking Pepto. Per RN, stools are now formed. No chronic GI concerns.No [place] of liver disease     ED Evaluation Lipase 76 tbili 19 AST 438 ALT 62 ETOH 204 INR 1.21 Normal WBC, hgb 12.3, MCV 116  Abdominopelvic CT scan with contrast - hepatomegaly, severe steatosis, some peripancreatic inflammation.    Past Medical History:  Diagnosis Date  . Hypertension     Past Surgical History:  Procedure Laterality Date  . BACK SURGERY      Prior to Admission medications   Medication Sig Start Date End Date Taking? Authorizing Provider  bismuth subsalicylate (PEPTO BISMOL) 262 MG/15ML suspension Take 30 mLs by mouth every 6 (six) hours as needed for indigestion.   Yes [provider]    Current Facility-Administered Medications  Medication Dose Route Frequency Provider Last Rate Last Dose  . folic acid (FOLVITE) tablet 1 mg  1 mg Oral Daily Eugenie Filler, MD   Stopped at 04/30/18 1217  . levofloxacin (LEVAQUIN) IVPB 750 mg  750 mg Intravenous Q24H Dorrene German, [place]   Stopped at 04/30/18 0831  . LORazepam (ATIVAN) injection 2-3 mg  2-3 mg Intravenous Q1H PRN Rise Patience, MD   2 mg at 04/30/18 1478  . magnesium sulfate IVPB 4 g 100 mL  4 g Intravenous Once Eugenie Filler, MD      . multivitamin with minerals tablet 1 tablet  1 tablet Oral Once Jola Schmidt, MD  Stopped at 04/29/18 2326  . ondansetron (ZOFRAN) tablet 4 mg  4 mg Oral Q6H PRN Rise Patience, MD       Or  . ondansetron [facility]) injection 4 mg  4 mg Intravenous Q6H PRN Rise Patience, MD      . pantoprazole (PROTONIX) EC tablet 40 mg  40 mg Oral Q0600 Eugenie Filler, MD      . potassium chloride SA (K-DUR,KLOR-CON) CR tablet 40 mEq  40 mEq Oral Q4H Eugenie Filler, MD   Stopped at 04/30/18 1216  . thiamine (VITAMIN B-1) tablet 100 mg  100 mg Oral Once Rise Patience, MD   Stopped at 04/29/18 2326    Allergies as  of 04/29/2018 - Review Complete 04/29/2018  Allergen Reaction Noted  . Penicillins  10/10/2008    Family History  Problem Relation Age of Onset  . CAD Father     Social History   Socioeconomic History  . Marital status: Married    Spouse name: Not on file  . Number of children: Not on file  . Years of education: Not on file  . Highest education level: Not on file  Occupational History  . Not on file  Social Needs  . Financial resource strain: Not on file  . Food insecurity:    Worry: Not on file    Inability: Not on file  . Transportation needs:    Medical: Not on file    Non-medical: Not on file  Tobacco Use  . Smoking status: Current Every Day Smoker    Packs/day: 1.00    Types: Cigarettes  . Smokeless tobacco: Current User    Types: Chew  Substance and Sexual Activity  . Alcohol use: Yes  . Drug use: Never  . Sexual activity: Not on file  Lifestyle  . Physical activity:    Days per week: Not on file    Minutes per session: Not on file  . Stress: Not on file  Relationships  . Social connections:    Talks on phone: Not on file    Gets together: Not on file    Attends religious service: Not on file    Active member of club or organization: Not on file    Attends meetings of clubs or organizations: Not on file    Relationship status: Not on file  . Intimate partner violence:    Fear of current or ex partner: Not on file    Emotionally abused: Not on file    Physically abused: Not on file    Forced sexual activity: Not on file  Other Topics Concern  . Not on file  Social History Narrative  . Not on file    Review of Systems: All systems reviewed and negative except where noted in HPI.  Physical Exam: Vital signs in last 24 hours: Temp:  [97.7 F (36.5 C)-100.6 F (38.1 C)] 99.8 F (37.7 C) (07/12 1200) Pulse Rate:  [86-117] 98 (07/12 1200) Resp:  [16-24] 18 (07/12 1200) BP: (107-130)/(63-85) 121/75 (07/12 1200) SpO2:  [93 %-99 %] 94 % (07/12  1200) Weight:  [170 lb (77.1 kg)-177 lb 11.1 oz (80.6 kg)] 177 lb 11.1 oz (80.6 kg) (07/12 0437) Last BM Date: 04/30/18 General:   Alert, male in NAD. Tremulous Psych:  Pleasant, cooperative. Normal mood and affect. Eyes:  Pupils equal, icteric sclera Ears:  Normal auditory acuity. Nose:  No deformity, discharge,  or lesions. Neck:  Supple; no masses Lungs:  Clear throughout to  auscultation.   No wheezes, crackles, or rhonchi.  Heart:  Regular rate and rhythm; no murmurs, no edema Abdomen:  Soft, distended, BS active, no palp mass but suboptimal exam due to distention   Rectal:  Deferred  Msk:  Symmetrical without gross deformities. . Neurologic:  Alert and  oriented x4;  grossly normal neurologically. Skin:  Intact without significant lesions or rashes..Spider nevi to chest   Intake/Output from previous day: 07/11 0701 - 07/12 0700 In: 206.7 [I.V.:206.7] Out: 300 [Urine:300] Intake/Output this shift: Total I/O In: 186.7 [IV Piggyback:186.7] Out: -   Lab Results: Recent Labs    04/29/18 1910 04/30/18 0319  WBC 8.7 7.2  HGB 12.3* 10.5*  HCT 34.9* 30.4*  PLT 197 200   BMET Recent Labs    04/29/18 1910 04/30/18 0319  NA 139 139  K 3.3* 3.0*  CL 105 107  CO2 24 24  GLUCOSE 108* 89  BUN 8 7  CREATININE <0.30* <0.30*  CALCIUM 7.8* 7.1*   LFT Recent Labs    04/30/18 0319  PROT 5.1*  ALBUMIN 1.7*  AST 389*  ALT 60*  ALKPHOS 348*  BILITOT 17.0*  BILIDIR 9.3*  IBILI 7.7*   PT/INR Recent Labs    04/29/18 2149 04/30/18 0319  LABPROT 14.5 15.2  INR 1.13 1.21    Studies/Results: Ct Abdomen Pelvis W Contrast  Result Date: 04/29/2018 CLINICAL DATA:  Bilateral lower extremity swelling for a week. Diarrhea for 1 week. Back pain for 1 month. Painless jaundice. EXAM: CT ABDOMEN AND PELVIS WITH CONTRAST TECHNIQUE: Multidetector CT imaging of the abdomen and pelvis was performed using the standard protocol following bolus administration of intravenous contrast.  CONTRAST:  163m ISOVUE-300 IOPAMIDOL (ISOVUE-300) INJECTION 61% COMPARISON:  CT abdomen and pelvis February 11, 2015 FINDINGS: LOWER CHEST: Bilateral lower lobe atelectasis. Included heart size is normal. No pericardial effusion. HEPATOBILIARY: Liver is diffusely severely hypodense, 25 cm in craniocaudad dimension. Patent main portal vein. Focal fatty sparing about the gallbladder fossa. Perihepatic fat stranding. Normal gallbladder. PANCREAS: Mild peripancreatic fat stranding about the head and uncinate process. No focal necrosis, ductal dilatation, calcifications or mass. SPLEEN: Normal. ADRENALS/URINARY TRACT: Kidneys are orthotopic, demonstrating symmetric enhancement. No nephrolithiasis, hydronephrosis or solid renal masses. Too small to characterize hypodensities bilateral kidneys. The unopacified ureters are normal in course and caliber. Delayed imaging through the kidneys demonstrates symmetric prompt contrast excretion within the proximal urinary collecting system. Urinary bladder is adequately distended and unremarkable. Normal adrenal glands. STOMACH/BOWEL: The stomach, small and large bowel are normal in course and caliber without inflammatory changes. Normal appendix. VASCULAR/LYMPHATIC: Aortoiliac vessels are normal in course and caliber. Mild calcific atherosclerosis. Small reactive portacaval lymph nodes. REPRODUCTIVE: Normal. OTHER: Small to moderate volume ascites. No intraperitoneal free air or focal fluid collections. MUSCULOSKELETAL: Nonacute. Small fat containing inguinal hernias. Moderate L5-S1 degenerative disc and probable disc extrusion which may affect the traversing LEFT S1 nerve (series 2, image 60). IMPRESSION: 1. Hepatomegaly, markedly hypodense liver seen with hepatitis or severe steatosis. Recommend correlation with liver function tests. 2. Mild peripancreatic inflammation may be reactive or reflect primary pancreatitis. No necrosis. 3. Small to moderate volume ascites. 4. L4-5  probable disc extrusion which may affect the traversing LEFT L5 nerve. Aortic Atherosclerosis (ICD10-I70.0). Electronically Signed   By: CElon AlasM.D.   On: 04/29/2018 22:28     PTye Savoy NP-C @  04/30/2018, 1:06 PM   Attending physician's note   I have taken a history, examined the patient and reviewed the  chart. I agree with the Advanced Practitioner's note, impression and recommendations. 47 year old male admitted with jaundice and alcohol withdrawal.  Total bili 19, PT 15.2. LFT pattern is consistent with acute alcoholic hepatitis, discriminant function score <32, no indication for steroids. No evidence of biliary obstruction or cholecystitis based on imaging Status post diagnostic paracentesis, consistent with portal hypertension.  Cell count was not done . Discussed alcohol cessation Monitor for alcohol withdrawal Follow-up viral hepatitis and also will need to exclude hereditary hemochromatosis (elevated ferritin 1693 and Iron sat 737) Folic acid '1mg'$  daily  Raliegh Ip Denzil Magnuson , MD [phone]

## 2018-04-30 NOTE — Progress Notes (Signed)
Pharmacy Antibiotic Note  Javier Grimes is a 47 y.o. male admitted on 04/29/2018 with SBP.  Pharmacy has been consulted for levaquin dosing.  Plan: Levaquin 750 mg IV q24h F/u scr/cultures  Height: 6' (182.9 cm) Weight: 177 lb 11.1 oz (80.6 kg) IBW/kg (Calculated) : 77.6  Temp (24hrs), Avg:98.4 F (36.9 C), Min:97.7 F (36.5 C), Max:99.1 F (37.3 C)  Recent Labs  Lab 04/29/18 1910 04/30/18 0319  WBC 8.7 7.2  CREATININE <0.30* <0.30*    CrCl cannot be calculated (This lab value cannot be used to calculate CrCl because it is not a number: <0.30).    Allergies  Allergen Reactions  . Penicillins     Has patient had a PCN reaction causing immediate rash, facial/tongue/throat swelling, SOB or lightheadedness with hypotension: Y Has patient had a PCN reaction causing severe rash involving mucus membranes or skin necrosis: Y Has patient had a PCN reaction that required hospitalization: N Has patient had a PCN reaction occurring within the last 10 years: N If all of the above answers are "NO", then may proceed with Cephalosporin use.     Antimicrobials this admission: 7/12 levaquin >>    >>   Dose adjustments this admission:   Microbiology results:  BCx:   UCx:    Sputum:    MRSA PCR:  Thank you for allowing pharmacy to be a part of this patient's care.  Javier Grimes, Javier Grimes 04/30/2018 5:50 AM

## 2018-04-30 NOTE — H&P (Addendum)
History and Physical    Javier CityRonald Florek ZOX:[id number] DOB: [birth year] DOA: 04/29/2018  PCP: Patient, No Pcp Per  Patient coming from: Home.  Chief Complaint: Jaundice.  HPI: Javier Grimes is a 47 y.o. male with stable alcohol abuse presents to the ER with complaints of having jaundice noticed over the last 2 weeks.  Patient has been having persistent diarrhea over the last 2 weeks feeling weak and abdomen getting mildly distended.  Patient also noticed lower extremity edema.  Since patient had persistent jaundice noticed over the last 2 weeks patient came to the ER.  Patient takes ibuprofen for his chronic low back pain for which patient also had surgery.  Denies taking Tylenol.  Admits to drinking alcohol every day lasting 5 hours prior to admission.  ED Course: In the ER patient on exam has jaundice and with tremors from withdrawal and bilirubin is around 19.  Hemoglobin is around 12.  CT abdomen and pelvis done shows hepatomegaly and some inflammation around the pancreas.  Lipase is mildly elevated.  AST was 438 ALT 62.  Patient admitted for acute hepatitis.  Review of Systems: As per HPI, rest all negative.   Past Medical History:  Diagnosis Date  . Hypertension     Past Surgical History:  Procedure Laterality Date  . BACK SURGERY       reports that he has been smoking cigarettes.  He has been smoking about 1.00 pack per day. His smokeless tobacco use includes chew. He reports that he drinks alcohol. He reports that he does not use drugs.  Allergies  Allergen Reactions  . Penicillins     Has patient had a PCN reaction causing immediate rash, facial/tongue/throat swelling, SOB or lightheadedness with hypotension: Y Has patient had a PCN reaction causing severe rash involving mucus membranes or skin necrosis: Y Has patient had a PCN reaction that required hospitalization: N Has patient had a PCN reaction occurring within the last 10 years: N If all of the above answers are  "NO", then may proceed with Cephalosporin use.     Family History  Problem Relation Age of Onset  . CAD Father     Prior to Admission medications   Medication Sig Start Date End Date Taking? Authorizing Provider  bismuth subsalicylate (PEPTO BISMOL) 262 MG/15ML suspension Take 30 mLs by mouth every 6 (six) hours as needed for indigestion.   Yes [provider]    Physical Exam: Vitals:   04/29/18 1901 04/29/18 1902 04/29/18 2025 04/29/18 2323  BP: 123/85  112/77 115/77  Pulse: 99  86 89  Resp: 18  16 (!) 21  Temp: 98.4 F (36.9 C)  97.7 F (36.5 C)   TempSrc: Oral  Oral   SpO2: 99%  99% 96%  Weight:  77.1 kg (170 lb)    Height:  6' (1.829 m)        Constitutional: Moderately built and nourished. Vitals:   04/29/18 1901 04/29/18 1902 04/29/18 2025 04/29/18 2323  BP: 123/85  112/77 115/77  Pulse: 99  86 89  Resp: 18  16 (!) 21  Temp: 98.4 F (36.9 C)  97.7 F (36.5 C)   TempSrc: Oral  Oral   SpO2: 99%  99% 96%  Weight:  77.1 kg (170 lb)    Height:  6' (1.829 m)     Eyes: Icterus present.  No pallor. ENMT: No discharge from the ears eyes nose or mouth. Neck: No mass felt.  No JVD appreciated. Respiratory: No rhonchi or  crepitations. Cardiovascular: S1-S2 heard no murmurs appreciated. Abdomen: Soft mildly distended nontender bowel sounds present. Musculoskeletal: Mild edema both lower extremities. Skin: No rash. Neurologic: Alert awake oriented to time place and person.  Moves all extremities.  Tremors. Psychiatric: Appears normal.   Labs on Admission: I have personally reviewed following labs and imaging studies  CBC: Recent Labs  Lab 04/29/18 1910  WBC 8.7  HGB 12.3*  HCT 34.9*  MCV 114.8*  PLT 197   Basic Metabolic Panel: Recent Labs  Lab 04/29/18 1910  NA 139  K 3.3*  CL 105  CO2 24  GLUCOSE 108*  BUN 8  CREATININE <0.30*  CALCIUM 7.8*   GFR: CrCl cannot be calculated (This lab value cannot be used to calculate CrCl because  it is not a number: <0.30). Liver Function Tests: Recent Labs  Lab 04/29/18 1910  AST 438*  ALT 66*  ALKPHOS 412*  BILITOT 19.8*  PROT 5.9*  ALBUMIN 1.9*   Recent Labs  Lab 04/29/18 1910  LIPASE 76*   No results for input(s): AMMONIA in the last 168 hours. Coagulation Profile: Recent Labs  Lab 04/29/18 2149  INR 1.13   Cardiac Enzymes: No results for input(s): CKTOTAL, CKMB, CKMBINDEX, TROPONINI in the last 168 hours. BNP (last 3 results) No results for input(s): PROBNP in the last 8760 hours. HbA1C: No results for input(s): HGBA1C in the last 72 hours. CBG: No results for input(s): GLUCAP in the last 168 hours. Lipid Profile: No results for input(s): CHOL, HDL, LDLCALC, TRIG, CHOLHDL, LDLDIRECT in the last 72 hours. Thyroid Function Tests: No results for input(s): TSH, T4TOTAL, FREET4, T3FREE, THYROIDAB in the last 72 hours. Anemia Panel: No results for input(s): VITAMINB12, FOLATE, FERRITIN, TIBC, IRON, RETICCTPCT in the last 72 hours. Urine analysis:    Component Value Date/Time   COLORURINE AMBER (A) 04/29/2018 2251   APPEARANCEUR CLEAR 04/29/2018 2251   LABSPEC 1.027 04/29/2018 2251   PHURINE 6.0 04/29/2018 2251   GLUCOSEU NEGATIVE 04/29/2018 2251   HGBUR NEGATIVE 04/29/2018 2251   BILIRUBINUR MODERATE (A) 04/29/2018 2251   KETONESUR NEGATIVE 04/29/2018 2251   PROTEINUR NEGATIVE 04/29/2018 2251   NITRITE NEGATIVE 04/29/2018 2251   LEUKOCYTESUR NEGATIVE 04/29/2018 2251   Sepsis Labs: @LABRCNTIP (procalcitonin:4,lacticidven:4) )No results found for this or any previous visit (from the past 240 hour(s)).   Radiological Exams on Admission: Ct Abdomen Pelvis W Contrast  Result Date: 04/29/2018 CLINICAL DATA:  Bilateral lower extremity swelling for a week. Diarrhea for 1 week. Back pain for 1 month. Painless jaundice. EXAM: CT ABDOMEN AND PELVIS WITH CONTRAST TECHNIQUE: Multidetector CT imaging of the abdomen and pelvis was performed using the standard  protocol following bolus administration of intravenous contrast. CONTRAST:  ISOVUE-300 IOPAMIDOL (ISOVUE-300) INJECTION 61% COMPARISON:  CT abdomen and pelvis February 11, 2015 FINDINGS: LOWER CHEST: Bilateral lower lobe atelectasis. Included heart size is normal. No pericardial effusion. HEPATOBILIARY: Liver is diffusely severely hypodense, 25 cm in craniocaudad dimension. Patent main portal vein. Focal fatty sparing about the gallbladder fossa. Perihepatic fat stranding. Normal gallbladder. PANCREAS: Mild peripancreatic fat stranding about the head and uncinate process. No focal necrosis, ductal dilatation, calcifications or mass. SPLEEN: Normal. ADRENALS/URINARY TRACT: Kidneys are orthotopic, demonstrating symmetric enhancement. No nephrolithiasis, hydronephrosis or solid renal masses. Too small to characterize hypodensities bilateral kidneys. The unopacified ureters are normal in course and caliber. Delayed imaging through the kidneys demonstrates symmetric prompt contrast excretion within the proximal urinary collecting system. Urinary bladder is adequately distended and unremarkable. Normal adrenal glands.  STOMACH/BOWEL: The stomach, small and large bowel are normal in course and caliber without inflammatory changes. Normal appendix. VASCULAR/LYMPHATIC: Aortoiliac vessels are normal in course and caliber. Mild calcific atherosclerosis. Small reactive portacaval lymph nodes. REPRODUCTIVE: Normal. OTHER: Small to moderate volume ascites. No intraperitoneal free air or focal fluid collections. MUSCULOSKELETAL: Nonacute. Small fat containing inguinal hernias. Moderate L5-S1 degenerative disc and probable disc extrusion which may affect the traversing LEFT S1 nerve (series 2, image 60). IMPRESSION: 1. Hepatomegaly, markedly hypodense liver seen with hepatitis or severe steatosis. Recommend correlation with liver function tests. 2. Mild peripancreatic inflammation may be reactive or reflect primary pancreatitis.  No necrosis. 3. Small to moderate volume ascites. 4. L4-5 probable disc extrusion which may affect the traversing LEFT L5 nerve. Aortic Atherosclerosis (ICD10-I70.0). Electronically Signed   By: Awilda Metro M.D.   On: 04/29/2018 22:28     Assessment/Plan Principal Problem:   Acute hepatitis Active Problems:   Jaundice   Diarrhea   Alcohol withdrawal (HCC)    1. Acute hepatitis with jaundice -likely alcoholic hepatitis.  Acute hepatitis panel has been sent to rule out any viral hepatitis.  Tylenol levels were negative.  We will closely monitor LFTs.  May be discussed with GI in a.m.  May need prednisone or pentoxifylline for hepatitis. 2. Alcohol withdrawal -patient has significant tremors and tachycardia from alcohol withdrawal and has been placed on stepdown protocol for alcohol withdrawal. 3. Ascites will get ultrasound-guided paracentesis and send labs for including cytology Gram stain and cell count.  Will keep patient on empiric antibiotics until then. 4. Diarrhea -denies taking any recent antibiotics or any sick contacts.  Check stools. 5. Hypokalemia replace and recheck. 6. Microcytic anemia likely from alcoholism.  Check anemia panel follow CBC. 7. Peripancreatic inflammation of the CAT scan.  We will get MRCP.   DVT prophylaxis: SCDs. Code Status: Full code. Family Communication: Discussed with patient. Disposition Plan: Home. Consults called: None. Admission status: Inpatient.   Eduard Clos MD [facility] Pager [phone].  If 7PM-7AM, please contact night-coverage [email or web address] Password [email or web address]  04/30/2018, 12:02 AM

## 2018-04-30 NOTE — Progress Notes (Signed)
PROGRESS NOTE    Javier Grimes  ZOX:[id number] DOB: 11-Aug-1971 DOA: 04/29/2018 PCP: Patient, No Pcp Per   Brief Narrative:  Javier Grimes is a 47 y.o. male with stable alcohol abuse presents to the ER with complaints of having jaundice noticed over the last 2 weeks.  Patient has been having persistent diarrhea over the last 2 weeks feeling weak and abdomen getting mildly distended.  Patient also noticed lower extremity edema.  Since patient had persistent jaundice noticed over the last 2 weeks patient came to the ER.  Patient takes ibuprofen for his chronic low back pain for which patient also had surgery.  Denies taking Tylenol.  Admits to drinking alcohol every day lasting 5 hours prior to admission.  ED Course: In the ER patient on exam has jaundice and with tremors from withdrawal and bilirubin is around 19.  Hemoglobin is around 12.  CT abdomen and pelvis done shows hepatomegaly and some inflammation around the pancreas.  Lipase is mildly elevated.  AST was 438 ALT 62.  Patient admitted for acute hepatitis.    Assessment & Plan:   Principal Problem:   Transaminitis Active Problems:   Hyperbilirubinemia   Jaundice   Acute hepatitis   Diarrhea   Alcohol withdrawal (HCC)   Ascites  1 jaundice/transaminitis/hyperbilirubinemia Questionable etiology.  Likely secondary to an acute alcoholic hepatitis versus a viral hepatitis versus concern for gallstone pancreatitis as patient noted to have inflammation around the pancreas on CT with elevated bilirubin levels.  Patient denies any excessive use of Tylenol.  Tylenol level was unremarkable.  Acute hepatitis panel pending.  MRCP pending.  Consult with GI for further evaluation and management.  2.  Alcohol withdrawal/alcohol abuse On admission patient noted to have significant tremors and tachycardia from alcohol withdrawal.  Tremors improving.  Patient states drinks about 1/5 of vodka on a daily basis for over 20 to 30 years.  Continue  the stepdown alcohol withdrawal protocol.  Continue thiamine, folic acid, multivitamin.  Alcohol cessation stressed to patient.  3.  Ascites Concern for SBP.  Ultrasound-guided therapeutic and diagnostic paracentesis pending.  Continue empiric IV Levaquin.  GI consultation pending.  4.  Diarrhea Patient with no recent antibiotics.??  Pancreatic insufficiency.  Patient denies any diarrhea this morning.  Stool studies pending.  Follow.  5.  Hypokalemia  magnesium level.  Keep magnesium greater than 2.  6.  Microcytic anemia Likely alcohol induced.  Patient with no overt bleeding.  Anemia panel with a elevated ferritin level.  Folate level is decreased at 3.2.  Vitamin B12 levels are elevated.  Continue folic acid.  Place on a PPI.  Follow H&H.  Transfusion threshold hemoglobin less than 7.  GI consultation pending.  7.  Peripancreatic inflammation per CT scan MRCP pending.  GI consult.    DVT prophylaxis: SCDs Code Status: Full Family Communication: Updated patient.  No family at bedside. Disposition Plan: Likely home when clinically improved.   Consultants:   Gastroenterology pending  Procedures:   CT abdomen and pelvis 04/29/2018  MRCP abdomen pending  Ultrasound-guided paracentesis pending  Antimicrobials:   IV Levaquin 04/30/2018   Subjective: In bed.  States has some tremors.  Denies any chest pain.  No shortness of breath.  Denies any abdominal pain however feels bloated.  States has not had diarrhea today.  Per RN patient noted to have loose stools overnight.  Objective: Vitals:   04/30/18 0600 04/30/18 0700 04/30/18 0800 04/30/18 1200  BP: 113/63 111/72 117/66 121/75  Pulse: Marland Kitchen)  104 (!) 111 (!) 117 98  Resp: 20 16 19 18   Temp:   (!) 100.6 F (38.1 C)   TempSrc:   Oral   SpO2: 94% 94% 95% 94%  Weight:      Height:        Intake/Output Summary (Last 24 hours) at 04/30/2018 1244 Last data filed at 04/30/2018 1200 Gross per 24 hour  Intake 393.34 ml    Output 300 ml  Net 93.34 ml   Filed Weights   04/29/18 1902 04/30/18 0437  Weight: 77.1 kg (170 lb) 80.6 kg (177 lb 11.1 oz)    Examination:  General exam: Jaundiced.  Tremors Respiratory system: Clear to auscultation. Respiratory effort normal. Cardiovascular system: S1 & S2 heard, RRR. No JVD, murmurs, rubs, gallops or clicks. No pedal edema. Gastrointestinal system: Abdomen is mildly distended, soft and nontender.  Positive hepatomegaly.  No rebound.  No guarding. Central nervous system: Alert and oriented. No focal neurological deficits. Extremities: Symmetric 5 x 5 power. Skin: No rashes, lesions or ulcers Psychiatry: Judgement and insight appear normal. Mood & affect appropriate.     Data Reviewed: I have personally reviewed following labs and imaging studies  CBC: Recent Labs  Lab 04/29/18 1910 04/30/18 0319  WBC 8.7 7.2  NEUTROABS  --  5.1  HGB 12.3* 10.5*  HCT 34.9* 30.4*  MCV 114.8* 116.0*  PLT 197 200   Basic Metabolic Panel: Recent Labs  Lab 04/29/18 1910 04/30/18 0319 04/30/18 0656  NA 139 139  --   K 3.3* 3.0*  --   CL 105 107  --   CO2 24 24  --   GLUCOSE 108* 89  --   BUN 8 7  --   CREATININE <0.30* <0.30*  --   CALCIUM 7.8* 7.1*  --   MG  --   --  1.7   GFR: CrCl cannot be calculated (This lab value cannot be used to calculate CrCl because it is not a number: <0.30). Liver Function Tests: Recent Labs  Lab 04/29/18 1910 04/30/18 0319  AST 438* 389*  ALT 66* 60*  ALKPHOS 412* 348*  BILITOT 19.8* 17.0*  PROT 5.9* 5.1*  ALBUMIN 1.9* 1.7*   Recent Labs  Lab 04/29/18 1910  LIPASE 76*   No results for input(s): AMMONIA in the last 168 hours. Coagulation Profile: Recent Labs  Lab 04/29/18 2149 04/30/18 0319  INR 1.13 1.21   Cardiac Enzymes: No results for input(s): CKTOTAL, CKMB, CKMBINDEX, TROPONINI in the last 168 hours. BNP (last 3 results) No results for input(s): PROBNP in the last 8760 hours. HbA1C: No results for  input(s): HGBA1C in the last 72 hours. CBG: No results for input(s): GLUCAP in the last 168 hours. Lipid Profile: No results for input(s): CHOL, HDL, LDLCALC, TRIG, CHOLHDL, LDLDIRECT in the last 72 hours. Thyroid Function Tests: No results for input(s): TSH, T4TOTAL, FREET4, T3FREE, THYROIDAB in the last 72 hours. Anemia Panel: Recent Labs    04/30/18 0656  VITAMINB12 1,385*  FOLATE 3.2*  FERRITIN 1,693*  TIBC 140*  IRON 141  RETICCTPCT 4.9*   Sepsis Labs: No results for input(s): PROCALCITON, LATICACIDVEN in the last 168 hours.  Recent Results (from the past 240 hour(s))  MRSA PCR Screening     Status: None   Collection Time: 04/30/18 12:37 AM  Result Value Ref Range Status   MRSA by PCR NEGATIVE NEGATIVE Final    Comment:        The GeneXpert MRSA Assay (FDA approved  for NASAL specimens only), is one component of a comprehensive MRSA colonization surveillance program. It is not intended to diagnose MRSA infection nor to guide or monitor treatment for MRSA infections. Performed at [facility], [address] [place], [place] [id number]          Radiology Studies: Ct Abdomen Pelvis W Contrast  Result Date: 04/29/2018 CLINICAL DATA:  Bilateral lower extremity swelling for a week. Diarrhea for 1 week. Back pain for 1 month. Painless jaundice. EXAM: CT ABDOMEN AND PELVIS WITH CONTRAST TECHNIQUE: Multidetector CT imaging of the abdomen and pelvis was performed using the standard protocol following bolus administration of intravenous contrast. CONTRAST:  100mL ISOVUE-300 IOPAMIDOL (ISOVUE-300) INJECTION 61% COMPARISON:  CT abdomen and pelvis February 11, 2015 FINDINGS: LOWER CHEST: Bilateral lower lobe atelectasis. Included heart size is normal. No pericardial effusion. HEPATOBILIARY: Liver is diffusely severely hypodense, 25 cm in craniocaudad dimension. Patent main portal vein. Focal fatty sparing about the gallbladder fossa. Perihepatic fat stranding.  Normal gallbladder. PANCREAS: Mild peripancreatic fat stranding about the head and uncinate process. No focal necrosis, ductal dilatation, calcifications or mass. SPLEEN: Normal. ADRENALS/URINARY TRACT: Kidneys are orthotopic, demonstrating symmetric enhancement. No nephrolithiasis, hydronephrosis or solid renal masses. Too small to characterize hypodensities bilateral kidneys. The unopacified ureters are normal in course and caliber. Delayed imaging through the kidneys demonstrates symmetric prompt contrast excretion within the proximal urinary collecting system. Urinary bladder is adequately distended and unremarkable. Normal adrenal glands. STOMACH/BOWEL: The stomach, small and large bowel are normal in course and caliber without inflammatory changes. Normal appendix. VASCULAR/LYMPHATIC: Aortoiliac vessels are normal in course and caliber. Mild calcific atherosclerosis. Small reactive portacaval lymph nodes. REPRODUCTIVE: Normal. OTHER: Small to moderate volume ascites. No intraperitoneal free air or focal fluid collections. MUSCULOSKELETAL: Nonacute. Small fat containing inguinal hernias. Moderate L5-S1 degenerative disc and probable disc extrusion which may affect the traversing LEFT S1 nerve (series 2, image 60). IMPRESSION: 1. Hepatomegaly, markedly hypodense liver seen with hepatitis or severe steatosis. Recommend correlation with liver function tests. 2. Mild peripancreatic inflammation may be reactive or reflect primary pancreatitis. No necrosis. 3. Small to moderate volume ascites. 4. L4-5 probable disc extrusion which may affect the traversing LEFT L5 nerve. Aortic Atherosclerosis (ICD10-I70.0). Electronically Signed   By: Awilda Metroourtnay  Bloomer M.D.   On: 04/29/2018 22:28        Scheduled Meds: . folic acid  1 mg Oral Daily  . multivitamin with minerals  1 tablet Oral Once  . potassium chloride  40 mEq Oral Q4H  . thiamine  100 mg Oral Once   Continuous Infusions: . levofloxacin (LEVAQUIN) IV  Stopped (04/30/18 0831)     LOS: 1 day    Time spent: 40 minutes  No charge    Ramiro Harvestaniel Thompson, MD [facility] Pager [phone]  If 7PM-7AM, please contact night-coverage [email or web address] Password [email or web address] 04/30/2018, 12:44 PM

## 2018-04-30 NOTE — Progress Notes (Signed)
Pt. Had a bowel movement that was formed and soft. Did not send stool sample to lab since it was formed and will be rejected. Will continue to monitor.

## 2018-05-01 ENCOUNTER — Inpatient Hospital Stay (HOSPITAL_COMMUNITY): Payer: PRIVATE HEALTH INSURANCE

## 2018-05-01 DIAGNOSIS — F1010 Alcohol abuse, uncomplicated: Secondary | ICD-10-CM | POA: Diagnosis not present

## 2018-05-01 DIAGNOSIS — F10230 Alcohol dependence with withdrawal, uncomplicated: Secondary | ICD-10-CM | POA: Diagnosis not present

## 2018-05-01 DIAGNOSIS — M545 Low back pain: Secondary | ICD-10-CM

## 2018-05-01 DIAGNOSIS — R188 Other ascites: Secondary | ICD-10-CM | POA: Diagnosis not present

## 2018-05-01 DIAGNOSIS — K7010 Alcoholic hepatitis without ascites: Secondary | ICD-10-CM | POA: Diagnosis not present

## 2018-05-01 DIAGNOSIS — R740 Nonspecific elevation of levels of transaminase and lactic acid dehydrogenase [LDH]: Secondary | ICD-10-CM | POA: Diagnosis not present

## 2018-05-01 DIAGNOSIS — R197 Diarrhea, unspecified: Secondary | ICD-10-CM | POA: Diagnosis not present

## 2018-05-01 DIAGNOSIS — K7200 Acute and subacute hepatic failure without coma: Secondary | ICD-10-CM | POA: Diagnosis not present

## 2018-05-01 DIAGNOSIS — E538 Deficiency of other specified B group vitamins: Secondary | ICD-10-CM | POA: Diagnosis present

## 2018-05-01 LAB — AMMONIA: Ammonia: 75 umol/L — ABNORMAL HIGH (ref 9–35)

## 2018-05-01 LAB — CBC
HCT: 31.7 % — ABNORMAL LOW (ref 39.0–52.0)
HEMOGLOBIN: 10.8 g/dL — AB (ref 13.0–17.0)
MCH: 40.6 pg — AB (ref 26.0–34.0)
MCHC: 34.1 g/dL (ref 30.0–36.0)
MCV: 119.2 fL — ABNORMAL HIGH (ref 78.0–100.0)
PLATELETS: 163 10*3/uL (ref 150–400)
RBC: 2.66 MIL/uL — ABNORMAL LOW (ref 4.22–5.81)
RDW: 18.1 % — ABNORMAL HIGH (ref 11.5–15.5)
WBC: 7.6 10*3/uL (ref 4.0–10.5)

## 2018-05-01 LAB — HEPATITIS PANEL, ACUTE
HEP B C IGM: NEGATIVE
HEP B S AG: NEGATIVE
Hep A IgM: NEGATIVE

## 2018-05-01 LAB — BASIC METABOLIC PANEL
Anion gap: 6 (ref 5–15)
BUN: 6 mg/dL (ref 6–20)
CO2: 23 mmol/L (ref 22–32)
Calcium: 7.4 mg/dL — ABNORMAL LOW (ref 8.9–10.3)
Chloride: 106 mmol/L (ref 98–111)
Creatinine, Ser: <0.3 mg/dL — ABNORMAL LOW (ref 0.61–1.24)
GLUCOSE: 87 mg/dL (ref 70–99)
POTASSIUM: 3.5 mmol/L (ref 3.5–5.1)
Sodium: 135 mmol/L (ref 135–145)

## 2018-05-01 LAB — HEPATIC FUNCTION PANEL
ALT: 57 U/L — AB (ref 0–44)
AST: 326 U/L — AB (ref 15–41)
Albumin: 1.6 g/dL — ABNORMAL LOW (ref 3.5–5.0)
Alkaline Phosphatase: 330 U/L — ABNORMAL HIGH (ref 38–126)
BILIRUBIN TOTAL: 20.3 mg/dL — AB (ref 0.3–1.2)
Bilirubin, Direct: 12.6 mg/dL — ABNORMAL HIGH (ref 0.0–0.2)
Indirect Bilirubin: 7.7 mg/dL — ABNORMAL HIGH (ref 0.3–0.9)
Total Protein: 5.1 g/dL — ABNORMAL LOW (ref 6.5–8.1)

## 2018-05-01 LAB — MAGNESIUM: MAGNESIUM: 2.4 mg/dL (ref 1.7–2.4)

## 2018-05-01 LAB — PROTIME-INR
INR: 1.26
Prothrombin Time: 15.7 seconds — ABNORMAL HIGH (ref 11.4–15.2)

## 2018-05-01 MED ORDER — NICOTINE 21 MG/24HR TD PT24
21.0000 mg | MEDICATED_PATCH | Freq: Every day | TRANSDERMAL | Status: DC
  Administered 2018-05-01 – 2018-05-06 (×6): 21 mg via TRANSDERMAL
  Filled 2018-05-01 (×6): qty 1

## 2018-05-01 MED ORDER — HYDROXYZINE HCL 25 MG PO TABS: 25.0000 mg | ORAL_TABLET | Freq: Four times a day (QID) | ORAL | Status: AC | PRN

## 2018-05-01 MED ORDER — ENSURE ENLIVE PO LIQD
237.0000 mL | Freq: Three times a day (TID) | ORAL | Status: DC
  Administered 2018-05-01 – 2018-05-06 (×15): 237 mL via ORAL

## 2018-05-01 MED ORDER — LOPERAMIDE HCL 2 MG PO CAPS: 2.0000 mg | ORAL_CAPSULE | ORAL | Status: AC | PRN

## 2018-05-01 MED ORDER — ENSURE ENLIVE PO LIQD: 237.0000 mL | Freq: Two times a day (BID) | ORAL | Status: DC

## 2018-05-01 MED ORDER — IBUPROFEN 200 MG PO TABS
600.0000 mg | ORAL_TABLET | Freq: Three times a day (TID) | ORAL | Status: AC
  Administered 2018-05-01 – 2018-05-05 (×15): 600 mg via ORAL
  Filled 2018-05-01 (×15): qty 3

## 2018-05-01 MED ORDER — ONDANSETRON 4 MG PO TBDP: 4.0000 mg | ORAL_TABLET | Freq: Four times a day (QID) | ORAL | Status: AC | PRN

## 2018-05-01 MED ORDER — CHLORDIAZEPOXIDE HCL 25 MG PO CAPS
25.0000 mg | ORAL_CAPSULE | Freq: Three times a day (TID) | ORAL | Status: AC
  Administered 2018-05-02 (×3): 25 mg via ORAL
  Filled 2018-05-01 (×3): qty 1

## 2018-05-01 MED ORDER — TRAZODONE HCL 50 MG PO TABS
50.0000 mg | ORAL_TABLET | Freq: Once | ORAL | Status: AC
  Administered 2018-05-02: 50 mg via ORAL
  Filled 2018-05-01: qty 1

## 2018-05-01 MED ORDER — LACTULOSE 10 GM/15ML PO SOLN
20.0000 g | Freq: Three times a day (TID) | ORAL | Status: DC
  Administered 2018-05-01 – 2018-05-02 (×5): 20 g via ORAL
  Filled 2018-05-01 (×5): qty 30

## 2018-05-01 MED ORDER — CHLORDIAZEPOXIDE HCL 25 MG PO CAPS
25.0000 mg | ORAL_CAPSULE | Freq: Four times a day (QID) | ORAL | Status: AC
  Administered 2018-05-01 (×4): 25 mg via ORAL
  Filled 2018-05-01 (×4): qty 1

## 2018-05-01 MED ORDER — CHLORDIAZEPOXIDE HCL 25 MG PO CAPS
25.0000 mg | ORAL_CAPSULE | Freq: Every day | ORAL | Status: AC
  Administered 2018-05-04: 25 mg via ORAL
  Filled 2018-05-01: qty 1

## 2018-05-01 MED ORDER — CHLORDIAZEPOXIDE HCL 25 MG PO CAPS: 25.0000 mg | ORAL_CAPSULE | Freq: Four times a day (QID) | ORAL | Status: AC | PRN

## 2018-05-01 MED ORDER — LOPERAMIDE HCL 2 MG PO CAPS: 2.0000 mg | ORAL_CAPSULE | ORAL | Status: DC | PRN

## 2018-05-01 MED ORDER — POTASSIUM CHLORIDE CRYS ER 20 MEQ PO TBCR
40.0000 meq | EXTENDED_RELEASE_TABLET | Freq: Once | ORAL | Status: AC
  Administered 2018-05-01: 40 meq via ORAL
  Filled 2018-05-01: qty 2

## 2018-05-01 MED ORDER — CHLORDIAZEPOXIDE HCL 25 MG PO CAPS
25.0000 mg | ORAL_CAPSULE | ORAL | Status: AC
  Administered 2018-05-03 (×2): 25 mg via ORAL
  Filled 2018-05-01 (×2): qty 1

## 2018-05-01 NOTE — Progress Notes (Signed)
CRITICAL VALUE ALERT  Critical Value:  Bilirubin 20.3  Date & Time Notied:  05-01-18 [id number]  Provider Notified: Bodenheimer  Orders Received/Actions taken:

## 2018-05-01 NOTE — Progress Notes (Addendum)
[facility] Progress Note   Chief Complaint:   Abnormal liver labs    SUBJECTIVE:    no complaints  ASSESSMENT AND PLAN:   ETOH abuse, probable ETOH hepatitis with marked cholestasis. Hepatomegaly and severe steatosis, ascites on CT scan -MDF < 32, no indications for steroids -Paracentesis yesterday, 280 ml taken. No SBP. Difficult to calculate SAAG with low fluid albumin. Cytology pending.  -INR stable at 1.26. Recheck in am -Bili rising, up to 20. Not unusual with acute ETOH.  -ETOH abstinence will be his biggest challenge   Hyperferritemia in setting of ETOH but saturation ratio high so need to rule out hemochromatosis. Labs ordered.    OBJECTIVE:     Vital signs in last 24 hours: Temp:  [98.1 F (36.7 C)-99.8 F (37.7 C)] 98.2 F (36.8 C) (07/13 0800) Pulse Rate:  [89-111] 98 (07/13 0800) Resp:  [15-22] 19 (07/13 0800) BP: (103-121)/(64-80) 103/74 (07/13 0800) SpO2:  [94 %-99 %] 98 % (07/13 0800) Weight:  [185 lb 13.6 oz (84.3 kg)] 185 lb 13.6 oz (84.3 kg) (07/13 0500) Last BM Date: 04/30/18 General:   Alert, male in NAD EENT:  Normal hearing, non icteric sclera, conjunctive pink.  Heart:  Regular rate and rhythm; no murmurs. No lower extremity edema Pulm: Normal respiratory effort. Abdomen:  Soft, nondistended, nontender.  Normal bowel sounds, no masses felt.     Neurologic:  Alert and  oriented x4;  grossly normal neurologically. Psych:   cooperative.  Flat affect.   Intake/Output from previous day: 07/12 0701 - 07/13 0700 In: 426.7 [P.O.:240; IV Piggyback:186.7] Out: 1650 [Urine:1650] Intake/Output this shift: No intake/output data recorded.  Lab Results: Recent Labs    04/29/18 1910 04/30/18 0319 05/01/18 0537  WBC 8.7 7.2 7.6  HGB 12.3* 10.5* 10.8*  HCT 34.9* 30.4* 31.7*  PLT 197 200 163   BMET Recent Labs    04/29/18 1910 04/30/18 0319 05/01/18 0537  NA 139 139 135  K 3.3* 3.0* 3.5  CL 105 107 106  CO2 24 24 23     GLUCOSE 108* 89 87  BUN 8 7 6   CREATININE <0.30* <0.30* <0.30*  CALCIUM 7.8* 7.1* 7.4*   LFT Recent Labs    05/01/18 0537  PROT 5.1*  ALBUMIN 1.6*  AST 326*  ALT 57*  ALKPHOS 330*  BILITOT 20.3*  BILIDIR 12.6*  IBILI 7.7*   PT/INR Recent Labs    04/30/18 0319 05/01/18 0537  LABPROT 15.2 15.7*  INR 1.21 1.26   Hepatitis Panel Recent Labs    04/29/18 2149  HEPBSAG Negative  HCVAB <0.1  HEPAIGM Negative  HEPBIGM Negative    Ct Abdomen Pelvis W Contrast  Result Date: 04/29/2018 CLINICAL DATA:  Bilateral lower extremity swelling for a week. Diarrhea for 1 week. Back pain for 1 month. Painless jaundice. EXAM: CT ABDOMEN AND PELVIS WITH CONTRAST TECHNIQUE: Multidetector CT imaging of the abdomen and pelvis was performed using the standard protocol following bolus administration of intravenous contrast. CONTRAST:  ISOVUE-300 IOPAMIDOL (ISOVUE-300) INJECTION 61% COMPARISON:  CT abdomen and pelvis February 11, 2015 FINDINGS: LOWER CHEST: Bilateral lower lobe atelectasis. Included heart size is normal. No pericardial effusion. HEPATOBILIARY: Liver is diffusely severely hypodense, 25 cm in craniocaudad dimension. Patent main portal vein. Focal fatty sparing about the gallbladder fossa. Perihepatic fat stranding. Normal gallbladder. PANCREAS: Mild peripancreatic fat stranding about the head and uncinate process. No focal necrosis, ductal dilatation, calcifications or mass. SPLEEN: Normal. ADRENALS/URINARY TRACT: Kidneys are orthotopic, demonstrating symmetric  enhancement. No nephrolithiasis, hydronephrosis or solid renal masses. Too small to characterize hypodensities bilateral kidneys. The unopacified ureters are normal in course and caliber. Delayed imaging through the kidneys demonstrates symmetric prompt contrast excretion within the proximal urinary collecting system. Urinary bladder is adequately distended and unremarkable. Normal adrenal glands. STOMACH/BOWEL: The stomach, small  and large bowel are normal in course and caliber without inflammatory changes. Normal appendix. VASCULAR/LYMPHATIC: Aortoiliac vessels are normal in course and caliber. Mild calcific atherosclerosis. Small reactive portacaval lymph nodes. REPRODUCTIVE: Normal. OTHER: Small to moderate volume ascites. No intraperitoneal free air or focal fluid collections. MUSCULOSKELETAL: Nonacute. Small fat containing inguinal hernias. Moderate L5-S1 degenerative disc and probable disc extrusion which may affect the traversing LEFT S1 nerve (series 2, image 60). IMPRESSION: 1. Hepatomegaly, markedly hypodense liver seen with hepatitis or severe steatosis. Recommend correlation with liver function tests. 2. Mild peripancreatic inflammation may be reactive or reflect primary pancreatitis. No necrosis. 3. Small to moderate volume ascites. 4. L4-5 probable disc extrusion which may affect the traversing LEFT L5 nerve. Aortic Atherosclerosis (ICD10-I70.0). Electronically Signed   By: Awilda Metro M.D.   On: 04/29/2018 22:28   Mr Abdomen Mrcp Wo Contrast  Result Date: 04/30/2018 CLINICAL DATA:  Abnormal liver function tests EXAM: MRI ABDOMEN WITHOUT CONTRAST  (INCLUDING MRCP) TECHNIQUE: Multiplanar multisequence MR imaging of the abdomen was performed. Heavily T2-weighted images of the biliary and pancreatic ducts were obtained, and three-dimensional MRCP images were rendered by post processing. COMPARISON:  CT 04/29/2018 FINDINGS: Lower chest:  Lung bases are clear. Hepatobiliary: There is marked loss of signal intensity on the opposed phase imaging (series 9) consistent diffuse fatty infiltration of the liver. There is no intrahepatic biliary duct dilatation. Gallbladder is not distended but there is small amount of pericholecystic fluid and gallbladder wall thickening. Small amount fluid also extends along the margin of the liver. Caudate lobe mildly enlarged. Recanalization umbilical vein. Common bile duct is normal caliber.  No biliary obstruction evident. Normal Pancreas: Normal pancreatic parenchymal intensity. No pancreatic duct dilatation. No para peripancreatic fluid collections. Spleen: Normal spleen. Adrenals/urinary tract: Adrenal glands and kidneys are normal. Stomach/Bowel: Stomach and limited of the small bowel is unremarkable Vascular/Lymphatic: Abdominal aortic normal caliber. No retroperitoneal periportal lymphadenopathy. Recanalization of the umbilical vein. Musculoskeletal: No aggressive osseous lesion IMPRESSION: 1. Marked loss of signal intensity with in the liver on opposed phase imaging is most consistent with severe hepatic steatosis. 2. Pericolic fluid, mild gallbladder wall thickening and small amount ascites along the margin liver is likely related to hepatic dysfunction. 3. No evidence of biliary obstruction. Common bile duct normal caliber. 4. No clear evidence of acute pancreatitis. 5. Recanalization of the umbilical vein and mild prominence of the caudate lobe suggests early cirrhosis/portal hypertension. Electronically Signed   By: Genevive Bi M.D.   On: 04/30/2018 15:10   Mr 3d Recon At Scanner  Result Date: 04/30/2018 CLINICAL DATA:  Abnormal liver function tests EXAM: MRI ABDOMEN WITHOUT CONTRAST  (INCLUDING MRCP) TECHNIQUE: Multiplanar multisequence MR imaging of the abdomen was performed. Heavily T2-weighted images of the biliary and pancreatic ducts were obtained, and three-dimensional MRCP images were rendered by post processing. COMPARISON:  CT 04/29/2018 FINDINGS: Lower chest:  Lung bases are clear. Hepatobiliary: There is marked loss of signal intensity on the opposed phase imaging (series 9) consistent diffuse fatty infiltration of the liver. There is no intrahepatic biliary duct dilatation. Gallbladder is not distended but there is small amount of pericholecystic fluid and gallbladder wall thickening. Small amount  fluid also extends along the margin of the liver. Caudate lobe mildly  enlarged. Recanalization umbilical vein. Common bile duct is normal caliber. No biliary obstruction evident. Normal Pancreas: Normal pancreatic parenchymal intensity. No pancreatic duct dilatation. No para peripancreatic fluid collections. Spleen: Normal spleen. Adrenals/urinary tract: Adrenal glands and kidneys are normal. Stomach/Bowel: Stomach and limited of the small bowel is unremarkable Vascular/Lymphatic: Abdominal aortic normal caliber. No retroperitoneal periportal lymphadenopathy. Recanalization of the umbilical vein. Musculoskeletal: No aggressive osseous lesion IMPRESSION: 1. Marked loss of signal intensity with in the liver on opposed phase imaging is most consistent with severe hepatic steatosis. 2. Pericolic fluid, mild gallbladder wall thickening and small amount ascites along the margin liver is likely related to hepatic dysfunction. 3. No evidence of biliary obstruction. Common bile duct normal caliber. 4. No clear evidence of acute pancreatitis. 5. Recanalization of the umbilical vein and mild prominence of the caudate lobe suggests early cirrhosis/portal hypertension. Electronically Signed   By: Genevive BiStewart  Edmunds M.D.   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 Request made for diagnostic and therapeutic paracentesis. EXAM: ULTRASOUND GUIDED DIAGNOSTIC AND THERAPEUTIC PARACENTESIS MEDICATIONS: None COMPLICATIONS: None immediate. PROCEDURE: Informed written consent was obtained from the patient after a discussion of the risks, benefits and alternatives to treatment. A timeout was performed prior to the initiation of the procedure. Initial ultrasound scanning demonstrates a small amount of ascites within the right lower abdominal quadrant. The right lower abdomen was prepped and draped in the usual sterile fashion. 1% lidocaine was used for  local anesthesia. Following this, a 19 gauge, 7-cm, Yueh catheter was introduced. An ultrasound image was saved for documentation purposes. The paracentesis was performed. The catheter was removed and a dressing was applied. The patient tolerated the procedure well without immediate post procedural complication. FINDINGS: A total of approximately 280 cc of golden yellow fluid was removed. Samples were sent to the laboratory as requested by the clinical team. Only minimal ascites was present. IMPRESSION: Successful ultrasound-guided diagnostic and therapeutic paracentesis yielding 280 cc of peritoneal fluid. Read by: Jeananne RamaKevin Allred, PA-C Electronically Signed   By: Judie PetitM.  Shick M.D.   On: 04/30/2018 16:02     Principal Problem:   Acute alcoholic hepatitis Active Problems:   Jaundice   Acute hepatitis   Diarrhea   Alcohol withdrawal (HCC)   Alcohol abuse   Ascites   Hyperbilirubinemia   Transaminitis   Folate deficiency     LOS: 2 days   Willette ClusterPaula Guenther ,NP 05/01/2018, 11:10 AM   Attending physician's note   I have taken an interval history, reviewed the chart and examined the patient. I agree with the Advanced Practitioner's note, impression and recommendations.   Iona BeardK. Veena Nandigam , MD [phone]

## 2018-05-01 NOTE — Progress Notes (Signed)
PROGRESS NOTE    Javier Grimes  RUE:[id number] DOB: 25-Mar-1971 DOA: 04/29/2018 PCP: Patient, No Pcp Per   Brief Narrative:  Saadiq Poche is a 47 y.o. male with stable alcohol abuse presents to the ER with complaints of having jaundice noticed over the last 2 weeks.  Patient has been having persistent diarrhea over the last 2 weeks feeling weak and abdomen getting mildly distended.  Patient also noticed lower extremity edema.  Since patient had persistent jaundice noticed over the last 2 weeks patient came to the ER.  Patient takes ibuprofen for his chronic low back pain for which patient also had surgery.  Denies taking Tylenol.  Admits to drinking alcohol every day lasting 5 hours prior to admission.  ED Course: In the ER patient on exam has jaundice and with tremors from withdrawal and bilirubin is around 19.  Hemoglobin is around 12.  CT abdomen and pelvis done shows hepatomegaly and some inflammation around the pancreas.  Lipase is mildly elevated.  AST was 438 ALT 62.  Patient admitted for acute hepatitis.    Assessment & Plan:   Principal Problem:   Acute alcoholic hepatitis Active Problems:   Hyperbilirubinemia   Jaundice   Transaminitis   Acute hepatitis   Diarrhea   Alcohol withdrawal (HCC)   Alcohol abuse   Ascites   Folate deficiency  1 acute alcoholic hepatitis/jaundice/transaminitis/hyperbilirubinemia Likely secondary to an acute alcoholic hepatitis.  Acute viral hepatitis panel negative.  MRCP/CT abdomen and pelvis negative for any biliary obstruction.  MRCP negative for acute pancreatitis.  LFTs still significantly elevated.  Bilirubin with no significant change since admission and elevated.  INR within normal limits.  Discriminant function score less than 32 per GI and no indication for steroids at this time.  Patient denies any excessive use of Tylenol.  Tylenol level was unremarkable.  GI following and appreciate input and recommendations.  2.  Alcohol  withdrawal/alcohol abuse On admission patient noted to have significant tremors and tachycardia from alcohol withdrawal.  Tremors improving.  Patient states drinks about 1/5 of vodka on a daily basis for over 20 to 30 years.  Continue the stepdown alcohol withdrawal protocol.  Will place on oral Librium detox protocol.  Continue thiamine, folic acid, multivitamin.  Alcohol cessation stressed to patient.  3.  Ascites Concern for SBP.  Status post ultrasound-guided therapeutic and diagnostic paracentesis yielding 280 cc of golden yellow fluid.  Patient with no abdominal pain.  Ascitic fluid with a glucose level of 89, albumin of less than 1, protein of less than 3, WBC of 60.  Ascitic fluid cultures preliminary readings with no organisms seen.  Patient with low-grade temp this morning, blood cultures will be ordered.  Chest x-ray ordered.  If chest x-ray is negative we will discontinue empiric IV Levaquin.  GI following.  4.  Diarrhea Patient with no recent antibiotics.??  Pancreatic insufficiency.  Diarrhea improved.  Will discontinue C. difficile PCR and stool studies.  Imodium as needed.  Follow.  5.  Hypokalemia  magnesium level at 2.4 today.  Replete potassium.  6.  Macrocytic anemia/folate deficiency Likely alcohol induced.  Patient with no overt bleeding.  Anemia panel with a elevated ferritin level.  Folate level is decreased at 3.2.  Vitamin B12 levels are elevated.  Continue folic acid.  Continue PPI.  Follow H&H.  Transfusion threshold hemoglobin less than 7.  GI consulted and are following.   7.  Peripancreatic inflammation per CT scan MRCP with no evidence of biliary obstruction  or cholecystitis.  MRCP with no evidence of acute pancreatitis.  GI consulted and following.  8 elevated ferritin ??  Hemochromatosis.  Per GI.  Follow.  9.  Protein calorie malnutrition Likely secondary to alcohol abuse/alcohol dependence.  Place on nutritional supplementation.  Consult with dietitian.  10  low back pain/bilateral lower extremity weakness Patient with complaints of left-sided low back pain and lower extremity weakness.  Patient denies any shooting pain down the left side of his leg.  Patient with some complaints of numbness on the bottom of his feet.  Patient states had surgery done on his back about 6 to 12 months ago.  CT abdomen and pelvis done on admission with L4-5 probable disc extrusion which may affect the transversing left L5 nerve.  Will need to follow-up with his neurosurgeon in the outpatient setting.    DVT prophylaxis: SCDs Code Status: Full Family Communication: Updated patient, wife, mother, sister at bedside. Disposition Plan: Likely home when clinically improved.   Consultants:   Gastroenterology: Dr. Lavon PaganiniNandigam 04/30/2018  Procedures:   CT abdomen and pelvis 04/29/2018  MRCP abdomen 04/30/2018  Ultrasound-guided paracentesis --280 cc of golden yellow fluid per interventional radiology Darrell Allred, PA 04/30/2018  Antimicrobials:   IV Levaquin 04/30/2018   Subjective: Sleeping.  Easily arousable.  Family at bedside.  No further diarrhea noted.  Denies any chest pain no shortness of breath.   Objective: Vitals:   05/01/18 0449 05/01/18 0500 05/01/18 0600 05/01/18 0800  BP:   117/80 103/74  Pulse:   94 98  Resp:   19 19  Temp: 98.3 F (36.8 C)   98.2 F (36.8 C)  TempSrc: Oral   Oral  SpO2:   99% 98%  Weight:  84.3 kg (185 lb 13.6 oz)    Height:        Intake/Output Summary (Last 24 hours) at 05/01/2018 0933 Last data filed at 05/01/2018 0420 Gross per 24 hour  Intake 426.67 ml  Output 1650 ml  Net -1223.33 ml   Filed Weights   04/29/18 1902 04/30/18 0437 05/01/18 0500  Weight: 77.1 kg (170 lb) 80.6 kg (177 lb 11.1 oz) 84.3 kg (185 lb 13.6 oz)    Examination:  General exam: Jaundiced.  Less Tremors Respiratory system: Lungs clear to auscultation bilaterally.  No wheezes, no crackles, no rhonchi.  Respiratory effort  normal. Cardiovascular system: Regular rate and rhythm no murmurs rubs or gallops.  No JVD.  No lower extremity edema. Gastrointestinal system: Abdomen is mildly distended, soft and nontender.  Hepatomegaly.  No rebound.  No guarding. Central nervous system: Alert and oriented. No focal neurological deficits. Extremities: Symmetric 5 x 5 power. Skin: No rashes, lesions or ulcers Psychiatry: Judgement and insight appear normal. Mood & affect appropriate.     Data Reviewed: I have personally reviewed following labs and imaging studies  CBC: Recent Labs  Lab 04/29/18 1910 04/30/18 0319 05/01/18 0537  WBC 8.7 7.2 7.6  NEUTROABS  --  5.1  --   HGB 12.3* 10.5* 10.8*  HCT 34.9* 30.4* 31.7*  MCV 114.8* 116.0* 119.2*  PLT 197 200 163   Basic Metabolic Panel: Recent Labs  Lab 04/29/18 1910 04/30/18 0319 04/30/18 0656 05/01/18 0537  NA 139 139  --  135  K 3.3* 3.0*  --  3.5  CL 105 107  --  106  CO2 24 24  --  23  GLUCOSE 108* 89  --  87  BUN 8 7  --  6  CREATININE <  0.30* <0.30*  --  <0.30*  CALCIUM 7.8* 7.1*  --  7.4*  MG  --   --  1.7 2.4   GFR: CrCl cannot be calculated (This lab value cannot be used to calculate CrCl because it is not a number: <0.30). Liver Function Tests: Recent Labs  Lab 04/29/18 1910 04/30/18 0319 05/01/18 0537  AST 438* 389* 326*  ALT 66* 60* 57*  ALKPHOS 412* 348* 330*  BILITOT 19.8* 17.0* 20.3*  PROT 5.9* 5.1* 5.1*  ALBUMIN 1.9* 1.7* 1.6*   Recent Labs  Lab 04/29/18 1910  LIPASE 76*   No results for input(s): AMMONIA in the last 168 hours. Coagulation Profile: Recent Labs  Lab 04/29/18 2149 04/30/18 0319 05/01/18 0537  INR 1.13 1.21 1.26   Cardiac Enzymes: No results for input(s): CKTOTAL, CKMB, CKMBINDEX, TROPONINI in the last 168 hours. BNP (last 3 results) No results for input(s): PROBNP in the last 8760 hours. HbA1C: No results for input(s): HGBA1C in the last 72 hours. CBG: No results for input(s): GLUCAP in the last  168 hours. Lipid Profile: No results for input(s): CHOL, HDL, LDLCALC, TRIG, CHOLHDL, LDLDIRECT in the last 72 hours. Thyroid Function Tests: No results for input(s): TSH, T4TOTAL, FREET4, T3FREE, THYROIDAB in the last 72 hours. Anemia Panel: Recent Labs    04/30/18 0656  VITAMINB12 1,385*  FOLATE 3.2*  FERRITIN 1,693*  TIBC 140*  IRON 141  RETICCTPCT 4.9*   Sepsis Labs: No results for input(s): PROCALCITON, LATICACIDVEN in the last 168 hours.  Recent Results (from the past 240 hour(s))  MRSA PCR Screening     Status: None   Collection Time: 04/30/18 12:37 AM  Result Value Ref Range Status   MRSA by PCR NEGATIVE NEGATIVE Final    Comment:        The GeneXpert MRSA Assay (FDA approved for NASAL specimens only), is one component of a comprehensive MRSA colonization surveillance program. It is not intended to diagnose MRSA infection nor to guide or monitor treatment for MRSA infections. Performed at [facility], [address] [place], [place] [address]   Body fluid culture     Status: None (Preliminary result)   Collection Time: 04/30/18  3:38 PM  Result Value Ref Range Status   Specimen Description   Final    ASCITIC Performed at [facility], [address], [place], [place] [address]    Special Requests   Final    NONE Performed at [facility], [address], [place], [place] [address]    Gram Stain   Final    MODERATE WBC PRESENT, PREDOMINANTLY MONONUCLEAR NO ORGANISMS SEEN Performed at [facility], [address], [place], [place] [address]    Culture PENDING  Incomplete   Report Status PENDING  Incomplete         Radiology Studies: Ct Abdomen Pelvis W Contrast  Result Date: 04/29/2018 CLINICAL DATA:  Bilateral lower extremity swelling for a week. Diarrhea for 1 week. Back pain for 1 month. Painless jaundice. EXAM: CT ABDOMEN AND PELVIS WITH CONTRAST TECHNIQUE: Multidetector CT  imaging of the abdomen and pelvis was performed using the standard protocol following bolus administration of intravenous contrast. CONTRAST:  ISOVUE-300 IOPAMIDOL (ISOVUE-300) INJECTION 61% COMPARISON:  CT abdomen and pelvis February 11, 2015 FINDINGS: LOWER CHEST: Bilateral lower lobe atelectasis. Included heart size is normal. No pericardial effusion. HEPATOBILIARY: Liver is diffusely severely hypodense, 25 cm in craniocaudad dimension. Patent main portal vein. Focal fatty  sparing about the gallbladder fossa. Perihepatic fat stranding. Normal gallbladder. PANCREAS: Mild peripancreatic fat stranding about the head and uncinate process. No focal necrosis, ductal dilatation, calcifications or mass. SPLEEN: Normal. ADRENALS/URINARY TRACT: Kidneys are orthotopic, demonstrating symmetric enhancement. No nephrolithiasis, hydronephrosis or solid renal masses. Too small to characterize hypodensities bilateral kidneys. The unopacified ureters are normal in course and caliber. Delayed imaging through the kidneys demonstrates symmetric prompt contrast excretion within the proximal urinary collecting system. Urinary bladder is adequately distended and unremarkable. Normal adrenal glands. STOMACH/BOWEL: The stomach, small and large bowel are normal in course and caliber without inflammatory changes. Normal appendix. VASCULAR/LYMPHATIC: Aortoiliac vessels are normal in course and caliber. Mild calcific atherosclerosis. Small reactive portacaval lymph nodes. REPRODUCTIVE: Normal. OTHER: Small to moderate volume ascites. No intraperitoneal free air or focal fluid collections. MUSCULOSKELETAL: Nonacute. Small fat containing inguinal hernias. Moderate L5-S1 degenerative disc and probable disc extrusion which may affect the traversing LEFT S1 nerve (series 2, image 60). IMPRESSION: 1. Hepatomegaly, markedly hypodense liver seen with hepatitis or severe steatosis. Recommend correlation with liver function tests. 2. Mild  peripancreatic inflammation may be reactive or reflect primary pancreatitis. No necrosis. 3. Small to moderate volume ascites. 4. L4-5 probable disc extrusion which may affect the traversing LEFT L5 nerve. Aortic Atherosclerosis (ICD10-I70.0). Electronically Signed   By: Awilda Metro M.D.   On: 04/29/2018 22:28   Mr Abdomen Mrcp Wo Contrast  Result Date: 04/30/2018 CLINICAL DATA:  Abnormal liver function tests EXAM: MRI ABDOMEN WITHOUT CONTRAST  (INCLUDING MRCP) TECHNIQUE: Multiplanar multisequence MR imaging of the abdomen was performed. Heavily T2-weighted images of the biliary and pancreatic ducts were obtained, and three-dimensional MRCP images were rendered by post processing. COMPARISON:  CT 04/29/2018 FINDINGS: Lower chest:  Lung bases are clear. Hepatobiliary: There is marked loss of signal intensity on the opposed phase imaging (series 9) consistent diffuse fatty infiltration of the liver. There is no intrahepatic biliary duct dilatation. Gallbladder is not distended but there is small amount of pericholecystic fluid and gallbladder wall thickening. Small amount fluid also extends along the margin of the liver. Caudate lobe mildly enlarged. Recanalization umbilical vein. Common bile duct is normal caliber. No biliary obstruction evident. Normal Pancreas: Normal pancreatic parenchymal intensity. No pancreatic duct dilatation. No para peripancreatic fluid collections. Spleen: Normal spleen. Adrenals/urinary tract: Adrenal glands and kidneys are normal. Stomach/Bowel: Stomach and limited of the small bowel is unremarkable Vascular/Lymphatic: Abdominal aortic normal caliber. No retroperitoneal periportal lymphadenopathy. Recanalization of the umbilical vein. Musculoskeletal: No aggressive osseous lesion IMPRESSION: 1. Marked loss of signal intensity with in the liver on opposed phase imaging is most consistent with severe hepatic steatosis. 2. Pericolic fluid, mild gallbladder wall thickening and small  amount ascites along the margin liver is likely related to hepatic dysfunction. 3. No evidence of biliary obstruction. Common bile duct normal caliber. 4. No clear evidence of acute pancreatitis. 5. Recanalization of the umbilical vein and mild prominence of the caudate lobe suggests early cirrhosis/portal hypertension. Electronically Signed   By: Genevive Bi M.D.   On: 04/30/2018 15:10   Mr 3d Recon At Scanner  Result Date: 04/30/2018 CLINICAL DATA:  Abnormal liver function tests EXAM: MRI ABDOMEN WITHOUT CONTRAST  (INCLUDING MRCP) TECHNIQUE: Multiplanar multisequence MR imaging of the abdomen was performed. Heavily T2-weighted images of the biliary and pancreatic ducts were obtained, and three-dimensional MRCP images were rendered by post processing. COMPARISON:  CT 04/29/2018 FINDINGS: Lower chest:  Lung bases are clear. Hepatobiliary: There is marked loss of signal intensity on  the opposed phase imaging (series 9) consistent diffuse fatty infiltration of the liver. There is no intrahepatic biliary duct dilatation. Gallbladder is not distended but there is small amount of pericholecystic fluid and gallbladder wall thickening. Small amount fluid also extends along the margin of the liver. Caudate lobe mildly enlarged. Recanalization umbilical vein. Common bile duct is normal caliber. No biliary obstruction evident. Normal Pancreas: Normal pancreatic parenchymal intensity. No pancreatic duct dilatation. No para peripancreatic fluid collections. Spleen: Normal spleen. Adrenals/urinary tract: Adrenal glands and kidneys are normal. Stomach/Bowel: Stomach and limited of the small bowel is unremarkable Vascular/Lymphatic: Abdominal aortic normal caliber. No retroperitoneal periportal lymphadenopathy. Recanalization of the umbilical vein. Musculoskeletal: No aggressive osseous lesion IMPRESSION: 1. Marked loss of signal intensity with in the liver on opposed phase imaging is most consistent with severe hepatic  steatosis. 2. Pericolic fluid, mild gallbladder wall thickening and small amount ascites along the margin liver is likely related to hepatic dysfunction. 3. No evidence of biliary obstruction. Common bile duct normal caliber. 4. No clear evidence of acute pancreatitis. 5. Recanalization of the umbilical vein and mild prominence of the caudate lobe suggests early cirrhosis/portal hypertension. Electronically Signed   By: Genevive Bi M.D.   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 Request made for diagnostic and therapeutic paracentesis. EXAM: ULTRASOUND GUIDED DIAGNOSTIC AND THERAPEUTIC PARACENTESIS MEDICATIONS: None COMPLICATIONS: None immediate. PROCEDURE: Informed written consent was obtained from the patient after a discussion of the risks, benefits and alternatives to treatment. A timeout was performed prior to the initiation of the procedure. Initial ultrasound scanning demonstrates a small amount of ascites within the right lower abdominal quadrant. The right lower abdomen was prepped and draped in the usual sterile fashion. 1% lidocaine was used for local anesthesia. Following this, a 19 gauge, 7-cm, Yueh catheter was introduced. An ultrasound image was saved for documentation purposes. The paracentesis was performed. The catheter was removed and a dressing was applied. The patient tolerated the procedure well without immediate post procedural complication. FINDINGS: A total of approximately 280 cc of golden yellow fluid was removed. Samples were sent to the laboratory as requested by the clinical team. Only minimal ascites was present. IMPRESSION: Successful ultrasound-guided diagnostic and therapeutic paracentesis yielding 280 cc of peritoneal fluid. Read by: Jeananne Rama, PA-C Electronically Signed   By: Judie Petit.  Shick M.D.   On: 04/30/2018 16:02         Scheduled Meds: . chlordiazePOXIDE  25 mg Oral QID   Followed by  . [START ON 05/02/2018] chlordiazePOXIDE  25 mg Oral TID   Followed by  . [START ON 05/03/2018] chlordiazePOXIDE  25 mg Oral BH-qamhs   Followed by  . [START ON 05/04/2018] chlordiazePOXIDE  25 mg Oral Daily  . feeding supplement (ENSURE ENLIVE)  237 mL Oral TID BM  . folic acid  1 mg Oral Daily  . multivitamin with minerals  1 tablet Oral Once  . pantoprazole  40 mg Oral Q0600  . potassium chloride  40 mEq Oral Once  . thiamine  100 mg Oral Once   Continuous Infusions: . levofloxacin (LEVAQUIN) IV Stopped (05/01/18 9604)     LOS: 2 days    Time spent: 40 minutes  No charge    Ramiro Harvest, MD [facility] Pager [phone]  If 7PM-7AM, please contact night-coverage [email or web address] Password [facility] 05/01/2018, 9:33 AM

## 2018-05-02 DIAGNOSIS — K7200 Acute and subacute hepatic failure without coma: Secondary | ICD-10-CM | POA: Clinically undetermined

## 2018-05-02 DIAGNOSIS — K7010 Alcoholic hepatitis without ascites: Secondary | ICD-10-CM | POA: Diagnosis not present

## 2018-05-02 DIAGNOSIS — R740 Nonspecific elevation of levels of transaminase and lactic acid dehydrogenase [LDH]: Secondary | ICD-10-CM | POA: Diagnosis not present

## 2018-05-02 DIAGNOSIS — K7682 Hepatic encephalopathy: Secondary | ICD-10-CM | POA: Clinically undetermined

## 2018-05-02 DIAGNOSIS — F1010 Alcohol abuse, uncomplicated: Secondary | ICD-10-CM | POA: Diagnosis not present

## 2018-05-02 DIAGNOSIS — R188 Other ascites: Secondary | ICD-10-CM | POA: Diagnosis not present

## 2018-05-02 DIAGNOSIS — M545 Low back pain, unspecified: Secondary | ICD-10-CM

## 2018-05-02 DIAGNOSIS — F10230 Alcohol dependence with withdrawal, uncomplicated: Secondary | ICD-10-CM | POA: Diagnosis not present

## 2018-05-02 LAB — CBC WITH DIFFERENTIAL/PLATELET
BASOS ABS: 0 10*3/uL (ref 0.0–0.1)
Basophils Relative: 0 %
EOS ABS: 0.3 10*3/uL (ref 0.0–0.7)
Eosinophils Relative: 3 %
HCT: 34.8 % — ABNORMAL LOW (ref 39.0–52.0)
HEMOGLOBIN: 11.8 g/dL — AB (ref 13.0–17.0)
LYMPHS PCT: 16 %
Lymphs Abs: 1.4 10*3/uL (ref 0.7–4.0)
MCH: 40.7 pg — ABNORMAL HIGH (ref 26.0–34.0)
MCHC: 33.9 g/dL (ref 30.0–36.0)
MCV: 120 fL — ABNORMAL HIGH (ref 78.0–100.0)
Monocytes Absolute: 0.5 10*3/uL (ref 0.1–1.0)
Monocytes Relative: 6 %
Neutro Abs: 6.8 10*3/uL (ref 1.7–7.7)
Neutrophils Relative %: 75 %
PLATELETS: 188 10*3/uL (ref 150–400)
RBC: 2.9 MIL/uL — AB (ref 4.22–5.81)
RDW: 18.2 % — ABNORMAL HIGH (ref 11.5–15.5)
WBC: 9 10*3/uL (ref 4.0–10.5)

## 2018-05-02 LAB — HEPATIC FUNCTION PANEL
ALBUMIN: 1.7 g/dL — AB (ref 3.5–5.0)
ALK PHOS: 333 U/L — AB (ref 38–126)
ALT: 53 U/L — AB (ref 0–44)
AST: 255 U/L — ABNORMAL HIGH (ref 15–41)
Bilirubin, Direct: 14.1 mg/dL — ABNORMAL HIGH (ref 0.0–0.2)
Indirect Bilirubin: 9.5 mg/dL — ABNORMAL HIGH (ref 0.3–0.9)
TOTAL PROTEIN: 5.6 g/dL — AB (ref 6.5–8.1)
Total Bilirubin: 23.6 mg/dL (ref 0.3–1.2)

## 2018-05-02 LAB — AMMONIA: Ammonia: 67 umol/L — ABNORMAL HIGH (ref 9–35)

## 2018-05-02 LAB — BASIC METABOLIC PANEL
ANION GAP: 6 (ref 5–15)
BUN: 8 mg/dL (ref 6–20)
CO2: 22 mmol/L (ref 22–32)
Calcium: 7.8 mg/dL — ABNORMAL LOW (ref 8.9–10.3)
Chloride: 109 mmol/L (ref 98–111)
Creatinine, Ser: <0.3 mg/dL — ABNORMAL LOW (ref 0.61–1.24)
GLUCOSE: 95 mg/dL (ref 70–99)
POTASSIUM: 3.7 mmol/L (ref 3.5–5.1)
Sodium: 137 mmol/L (ref 135–145)

## 2018-05-02 LAB — PROTIME-INR
INR: 1.19
Prothrombin Time: 15 seconds (ref 11.4–15.2)

## 2018-05-02 LAB — MAGNESIUM: Magnesium: 2.3 mg/dL (ref 1.7–2.4)

## 2018-05-02 MED ORDER — LORAZEPAM 2 MG/ML IJ SOLN: 2.0000 mg | Freq: Four times a day (QID) | INTRAMUSCULAR | Status: AC | PRN

## 2018-05-02 MED ORDER — LORAZEPAM 1 MG PO TABS: 2.0000 mg | ORAL_TABLET | Freq: Four times a day (QID) | ORAL | Status: AC | PRN

## 2018-05-02 NOTE — Progress Notes (Addendum)
[facility] Progress Note   Chief Complaint:   Abnormal liver labs   SUBJECTIVE:   Feels okay, No abdominal pain.     ASSESSMENT AND PLAN:   1. ETOH abuse / Probable hepatitis with marked cholestasis. Severe steatosis / possible early cirrhosis on imaging with recanalization of the umbilical vein.  -MDF < 32, no indication for steroids.  -paracentesis two days ago with removal of ~ 300 ml. No SBO. Difficult to calculate SAAG with low fluid albumin. Cytology pending.  -INR improving - 1.19 today -Bili still rising, nearly 24 today but not unusual with acute ETOH -another discussion with patient, and today with his wife as well, about ETOH consumption and importance of abstinence going forward  2. Hyperferritinemia in setting of  ETOH abuse but saturation ratio high so ruling out hemochromatosis. Labs pending.   3. ? Mild pancreatitis on CT scan 7/11. Seems unlikely given absence of abdominal pain  OBJECTIVE:     Vital signs in last 24 hours: Temp:  [97.6 F (36.4 C)-98.2 F (36.8 C)] 98.1 F (36.7 C) (07/14 0800) Pulse Rate:  [37-101] 90 (07/14 0600) Resp:  [16-29] 17 (07/14 0500) BP: (95-131)/(59-94) 131/94 (07/14 0300) SpO2:  [37 %-100 %] 98 % (07/14 0600) Last BM Date: 04/30/18 General:   Alert, well-developed,male in NAD Heart:  Regular rate and rhythm; no murmurs. No lower extremity edema Pulm: Normal respiratory effort, lungs CTA bilaterally without wheezes or crackles. Abdomen:  Soft, moderately distended, nontender.  Normal bowel sounds, no masses felt.     Neurologic:  Alert and  oriented x4;  grossly normal neurologically. Psych:  Pleasant, cooperative.  Normal mood and affect.   Intake/Output from previous day: 07/13 0701 - 07/14 0700 In: 0  Out: 1875 [Urine:1875] Intake/Output this shift: No intake/output data recorded.  Lab Results: Recent Labs    04/30/18 0319 05/01/18 0537 05/02/18 0406  WBC 7.2 7.6 9.0  HGB 10.5* 10.8*  11.8*  HCT 30.4* 31.7* 34.8*  PLT 200 163 188   BMET Recent Labs    04/30/18 0319 05/01/18 0537 05/02/18 0406  NA 139 135 137  K 3.0* 3.5 3.7  CL 107 106 109  CO2 24 23 22   GLUCOSE 89 87 95  BUN 7 6 8   CREATININE <0.30* <0.30* <0.30*  CALCIUM 7.1* 7.4* 7.8*   LFT Recent Labs    05/02/18 0406  PROT 5.6*  ALBUMIN 1.7*  AST 255*  ALT 53*  ALKPHOS 333*  BILITOT 23.6*  BILIDIR 14.1*  IBILI 9.5*   PT/INR Recent Labs    05/01/18 0537 05/02/18 0406  LABPROT 15.7* 15.0  INR 1.26 1.19   Hepatitis Panel Recent Labs    04/29/18 2149  HEPBSAG Negative  HCVAB <0.1  HEPAIGM Negative  HEPBIGM Negative   Mr 3d Recon At Scanner  Result Date: 04/30/2018 CLINICAL DATA:  Abnormal liver function tests EXAM: MRI ABDOMEN WITHOUT CONTRAST  (INCLUDING MRCP) TECHNIQUE: Multiplanar multisequence MR imaging of the abdomen was performed. Heavily T2-weighted images of the biliary and pancreatic ducts were obtained, and three-dimensional MRCP images were rendered by post processing. COMPARISON:  CT 04/29/2018 FINDINGS: Lower chest:  Lung bases are clear. Hepatobiliary: There is marked loss of signal intensity on the opposed phase imaging (series 9) consistent diffuse fatty infiltration of the liver. There is no intrahepatic biliary duct dilatation. Gallbladder is not distended but there is small amount of pericholecystic fluid and gallbladder wall thickening. Small amount fluid also extends along the margin of  the liver. Caudate lobe mildly enlarged. Recanalization umbilical vein. Common bile duct is normal caliber. No biliary obstruction evident. Normal Pancreas: Normal pancreatic parenchymal intensity. No pancreatic duct dilatation. No para peripancreatic fluid collections. Spleen: Normal spleen. Adrenals/urinary tract: Adrenal glands and kidneys are normal. Stomach/Bowel: Stomach and limited of the small bowel is unremarkable Vascular/Lymphatic: Abdominal aortic normal caliber. No  retroperitoneal periportal lymphadenopathy. Recanalization of the umbilical vein. Musculoskeletal: No aggressive osseous lesion IMPRESSION: 1. Marked loss of signal intensity with in the liver on opposed phase imaging is most consistent with severe hepatic steatosis. 2. Pericolic fluid, mild gallbladder wall thickening and small amount ascites along the margin liver is likely related to hepatic dysfunction. 3. No evidence of biliary obstruction. Common bile duct normal caliber. 4. No clear evidence of acute pancreatitis. 5. Recanalization of the umbilical vein and mild prominence of the caudate lobe suggests early cirrhosis/portal hypertension. Electronically Signed   By: Genevive BiStewart  Edmunds M.D.   On: 04/30/2018 15:10     Principal Problem:   Acute alcoholic hepatitis Active Problems:   Jaundice   Acute hepatitis   Diarrhea   Alcohol withdrawal (HCC)   Alcohol abuse   Ascites   Hyperbilirubinemia   Transaminitis   Folate deficiency     LOS: 3 days   Willette ClusterPaula Guenther ,NP 05/02/2018, 9:08 AM     Attending physician's note   I have taken an interval history, reviewed the chart and examined the patient. I agree with the Advanced Practitioner's note, impression and recommendations.  Acute alcoholic hepatitis Bilirubin continues to trend up, has not peaked yet.  Continue to monitor daily LFT INR is trending down 1.19 this morning Diagnostic paracentesis negative for SBP, consistent with portal hypertension with low albumin Await hemochromatosis gene analysis Hepatic encephalopathy: Continue lactulose and titrate with goal 2-3 bowel movements daily Discussed alcohol cessation  Kirtland BouchardK. Scherry RanVeena Taneasha Fuqua , MD [phone]

## 2018-05-02 NOTE — Progress Notes (Addendum)
PROGRESS NOTE    Javier Grimes  ZOX:[id number] DOB: 1971-03-07 DOA: 04/29/2018 PCP: Javier Grimes   Brief Narrative:  Javier Grimes is a 47 y.o. male with stable alcohol abuse presents to the ER with complaints of having jaundice noticed over the last 2 weeks.  Patient has been having persistent diarrhea over the last 2 weeks feeling weak and abdomen getting mildly distended.  Patient also noticed lower extremity edema.  Since patient had persistent jaundice noticed over the last 2 weeks patient came to the ER.  Patient takes ibuprofen for his chronic low back pain for which patient also had surgery.  Denies taking Tylenol.  Admits to drinking alcohol every day lasting 5 hours prior to admission.  ED Course: In the ER patient on exam has jaundice and with tremors from withdrawal and bilirubin is around 19.  Hemoglobin is around 12.  CT abdomen and pelvis done shows hepatomegaly and some inflammation around the pancreas.  Lipase is mildly elevated.  AST was 438 ALT 62.  Patient admitted for acute hepatitis.    Assessment & Plan:   Principal Problem:   Acute alcoholic hepatitis Active Problems:   Hyperbilirubinemia   Acute hepatic encephalopathy   Jaundice   Transaminitis   Acute hepatitis   Diarrhea   Alcohol withdrawal (HCC)   Alcohol abuse   Ascites   Folate deficiency  1 acute alcoholic hepatitis/jaundice/transaminitis/hyperbilirubinemia Likely secondary to an acute alcoholic hepatitis.  Acute viral hepatitis panel negative.  MRCP/CT abdomen and pelvis negative for any biliary obstruction.  MRCP negative for acute pancreatitis.  LFTs still significantly elevated.  Bilirubin slowly trending up.  INR within normal limits.  Discriminant function score less than 32 [facility] and no indication for steroids at this time.  Patient denies any excessive use of Tylenol.  Tylenol level was unremarkable.  GI following and appreciate input and recommendations.  2.  Hepatic  encephalopathy [facility] patient with increasing confusion.  CT head which was obtained was negative for any acute abnormalities.  Ammonia level obtained on 05/01/2018 was 75.  Ammonia level today at 67.  Per RN patient refused lactulose overnight and as such did not receive receive nighttime dose lactulose.  Continue current dose of lactulose for now and uptitrate as needed.  If no significant improvement or worsening may consider addition of Xifaxan.  GI following.  3.  Alcohol withdrawal/alcohol abuse On admission patient noted to have significant tremors and tachycardia from alcohol withdrawal.  Tremors improved.  Patient states drinks about 1/5 of vodka on a daily basis for over 20 to 30 years.  Patient started on Librium detox protocol which we will continue for now.  Continue thiamine, folic acid, multivitamin.  Will discontinue stepdown alcohol withdrawal protocol and place on as needed IV Ativan as well.  Alcohol cessation stressed to patient.    4.  Ascites Concern for SBP.  Status post ultrasound-guided therapeutic and diagnostic paracentesis yielding 280 cc of golden yellow fluid.  Patient with no abdominal pain.  Ascitic fluid with a glucose level of 89, albumin of less than 1, protein of less than 3, WBC of 60.  Ascitic fluid cultures preliminary readings with no organisms seen.  Patient currently afebrile.  Chest x-ray was negative as patient was noted to have a low-grade temperature on 05/01/2018.  Blood cultures pending.  IV Levaquin has been discontinued.  No need for antibiotics at this time.  GI following.  5.  Diarrhea Patient with no recent antibiotics.??  Pancreatic  insufficiency.  Diarrhea improved.  C. difficile PCR and stool studies discontinued.  Patient on Imodium as needed.  Patient has been started on lactulose for probable hepatic encephalopathy.  Monitor stool output.   6.  Hypokalemia  magnesium level at 2.3 today.  Potassium at 3.7.   7.  Macrocytic anemia/folate  deficiency Likely alcohol induced.  Patient with no overt bleeding.  Anemia panel with a elevated ferritin level.  Folate level is decreased at 3.2.  Vitamin B12 levels are elevated.  Continue folic acid.  Continue PPI.  Follow H&H.  Transfusion threshold hemoglobin less than 7.  GI consulted and are following.   8.  Peripancreatic inflammation Grimes CT scan MRCP with no evidence of biliary obstruction or cholecystitis.  MRCP with no evidence of acute pancreatitis.  GI consulted and following.  9 elevated ferritin ??  Hemochromatosis.  Labs for hemochromatosis pending [facility]. Follow.  10.  Protein calorie malnutrition Likely secondary to alcohol abuse/alcohol dependence.  Continue nutritional supplementation.  Dietitian consultation pending.    11. low back pain/bilateral lower extremity weakness Patient with complaints of left-sided low back pain and lower extremity weakness.  Patient denies any shooting pain down the left side of his leg.  Patient with some complaints of numbness on the bottom of his feet.  Patient states had surgery done on his back about 6 to 12 months ago.  CT abdomen and pelvis done on admission with L4-5 probable disc extrusion which may affect the transversing left L5 nerve.  Will need to follow-up with his neurosurgeon in the outpatient setting.    DVT prophylaxis: SCDs Code Status: Full Family Communication: Updated patient, wife, sister at bedside. Disposition Plan: Transfer to telemetry.     Consultants:   Gastroenterology: Dr. Lavon Paganini 04/30/2018  Procedures:   CT abdomen and pelvis 04/29/2018  MRCP abdomen 04/30/2018  Ultrasound-guided paracentesis --280 cc of golden yellow fluid [facility] Darrell Allred, PA 04/30/2018  CT head 05/01/2018  Chest x-ray 05/01/2018  Antimicrobials:   IV Levaquin 04/30/2018>>>>05/02/2018   Subjective: Sitting in chair.  Patient alert to self and place.  Patient thinks is October.  [facility] family patient  more confused than his baseline.  Patient denies any chest pain or shortness of breath.  Objective: Vitals:   05/02/18 0500 05/02/18 0600 05/02/18 0800 05/02/18 1025  BP:    110/71  Pulse: 94 90  80  Resp: 17   17  Temp:   98.1 F (36.7 C)   TempSrc:   Oral   SpO2: 98% 98%  100%  Weight:      Height:        Intake/Output Summary (Last 24 hours) at 05/02/2018 1043 Last data filed at 05/02/2018 0400 Gross [facility] 24 hour  Intake 0 ml  Output 1150 ml  Net -1150 ml   Filed Weights   04/29/18 1902 04/30/18 0437 05/01/18 0500  Weight: 77.1 kg (170 lb) 80.6 kg (177 lb 11.1 oz) 84.3 kg (185 lb 13.6 oz)    Examination:  General exam: Jaundiced.  Less Tremors Respiratory system: CTAB.  No wheezes, no crackles, no rhonchi.  Normal respiratory effort.   Cardiovascular system: RRR no m/r/g. No JVD.  No lower extremity edema. Gastrointestinal system: Abdomen is mildly distended, soft and nontender.  Hepatomegaly.  No rebound.  No guarding. Central nervous system: Alert and oriented to self and place. No focal neurological deficits. Extremities: Symmetric 5 x 5 power. Skin: No rashes, lesions or ulcers Psychiatry: Judgement and insight appear  poor-fair. Mood & affect appropriate.     Data Reviewed: I have personally reviewed following labs and imaging studies  CBC: Recent Labs  Lab 04/29/18 1910 04/30/18 0319 05/01/18 0537 05/02/18 0406  WBC 8.7 7.2 7.6 9.0  NEUTROABS  --  5.1  --  6.8  HGB 12.3* 10.5* 10.8* 11.8*  HCT 34.9* 30.4* 31.7* 34.8*  MCV 114.8* 116.0* 119.2* 120.0*  PLT 197 200 163 188   Basic Metabolic Panel: Recent Labs  Lab 04/29/18 1910 04/30/18 0319 04/30/18 0656 05/01/18 0537 05/02/18 0406  NA 139 139  --  135 137  K 3.3* 3.0*  --  3.5 3.7  CL 105 107  --  106 109  CO2 24 24  --  23 22  GLUCOSE 108* 89  --  87 95  BUN 8 7  --  6 8  CREATININE <0.30* <0.30*  --  <0.30* <0.30*  CALCIUM 7.8* 7.1*  --  7.4* 7.8*  MG  --   --  1.7 2.4 2.3   GFR: CrCl  cannot be calculated (This lab value cannot be used to calculate CrCl because it is not a number: <0.30). Liver Function Tests: Recent Labs  Lab 04/29/18 1910 04/30/18 0319 05/01/18 0537 05/02/18 0406  AST 438* 389* 326* 255*  ALT 66* 60* 57* 53*  ALKPHOS 412* 348* 330* 333*  BILITOT 19.8* 17.0* 20.3* 23.6*  PROT 5.9* 5.1* 5.1* 5.6*  ALBUMIN 1.9* 1.7* 1.6* 1.7*   Recent Labs  Lab 04/29/18 1910  LIPASE 76*   Recent Labs  Lab 05/01/18 1101 05/02/18 0406  AMMONIA 75* 67*   Coagulation Profile: Recent Labs  Lab 04/29/18 2149 04/30/18 0319 05/01/18 0537 05/02/18 0406  INR 1.13 1.21 1.26 1.19   Cardiac Enzymes: No results for input(s): CKTOTAL, CKMB, CKMBINDEX, TROPONINI in the last 168 hours. BNP (last 3 results) No results for input(s): PROBNP in the last 8760 hours. HbA1C: No results for input(s): HGBA1C in the last 72 hours. CBG: No results for input(s): GLUCAP in the last 168 hours. Lipid Profile: No results for input(s): CHOL, HDL, LDLCALC, TRIG, CHOLHDL, LDLDIRECT in the last 72 hours. Thyroid Function Tests: No results for input(s): TSH, T4TOTAL, FREET4, T3FREE, THYROIDAB in the last 72 hours. Anemia Panel: Recent Labs    04/30/18 0656  VITAMINB12 1,385*  FOLATE 3.2*  FERRITIN 1,693*  TIBC 140*  IRON 141  RETICCTPCT 4.9*   Sepsis Labs: No results for input(s): PROCALCITON, LATICACIDVEN in the last 168 hours.  Recent Results (from the past 240 hour(s))  MRSA PCR Screening     Status: None   Collection Time: 04/30/18 12:37 AM  Result Value Ref Range Status   MRSA by PCR NEGATIVE NEGATIVE Final    Comment:        The GeneXpert MRSA Assay (FDA approved for NASAL specimens only), is one component of a comprehensive MRSA colonization surveillance program. It is not intended to diagnose MRSA infection nor to guide or monitor treatment for MRSA infections. Performed at [facility], [address] [place], [place] [address]     Body fluid culture     Status: None (Preliminary result)   Collection Time: 04/30/18  3:38 PM  Result Value Ref Range Status   Specimen Description   Final    ASCITIC Performed at [facility], [address], [place], [place] [address]    Special Requests   Final    NONE Performed at [facility], [address] [place], [place] [address]    Gram Stain   Final    MODERATE WBC PRESENT, PREDOMINANTLY MONONUCLEAR NO ORGANISMS SEEN    Culture   Final    NO GROWTH < 24 HOURS Performed at [facility], [address] [place], [place] [address]    Report Status PENDING  Incomplete  Culture, blood (Routine X 2) w Reflex to ID Panel     Status: None (Preliminary result)   Collection Time: 05/01/18  8:01 AM  Result Value Ref Range Status   Specimen Description   Final    BLOOD RIGHT ANTECUBITAL Performed at [facility], [address], [place], [place] [address]    Special Requests   Final    BOTTLES DRAWN AEROBIC ONLY Blood Culture results may not be optimal due to an excessive volume of blood received in culture bottles Performed at [facility], [address], [place], [place] [address]    Culture PENDING  Incomplete   Report Status PENDING  Incomplete         Radiology Studies: Dg Chest 2 View  Result Date: 05/01/2018 CLINICAL DATA:  Fever. EXAM: CHEST - 2 VIEW COMPARISON:  December 13, 2016 FINDINGS: Tiny pleural effusions. The heart, hila, mediastinum, lungs, and pleura are otherwise normal. IMPRESSION: Tiny pleural effusions.  No other abnormalities. Electronically Signed   By: Gerome Sam III M.D   On: 05/01/2018 11:23   Ct Head Wo Contrast  Result Date: 05/01/2018 CLINICAL DATA:  47 year old male with history of encephalopathy. Alcohol withdrawal. EXAM: CT HEAD WITHOUT CONTRAST TECHNIQUE: Contiguous axial images were obtained from the base of the skull through the vertex without intravenous  contrast. COMPARISON:  Head CT 02/15/2018. FINDINGS: Brain: Mild cerebral and cerebellar atrophy. Patchy areas of decreased attenuation are noted throughout the deep and periventricular white matter of the cerebral hemispheres bilaterally, compatible with chronic microvascular ischemic disease. No evidence of acute infarction, hemorrhage, hydrocephalus, extra-axial collection or mass lesion/mass effect. Vascular: No hyperdense vessel or unexpected calcification. Skull: Normal. Negative for fracture or focal lesion. Sinuses/Orbits: Extensive mucoperiosteal thickening noted in the paranasal sinuses, most severe in the maxillary sinuses bilaterally and in the right sphenoid sinus. Right maxillary sinus is completely opacified. Mastoids are clear. Other: There are no aggressive appearing lytic or blastic lesions noted in the visualized portions of the skeleton. IMPRESSION: 1. No acute intracranial abnormalities. 2. Mild cerebral and cerebellar atrophy with chronic microvascular ischemic changes in the cerebral white matter, as above. 3. Severe chronic paranasal sinus disease, as discussed above. Electronically Signed   By: Trudie Reed M.D.   On: 05/01/2018 12:02   Mr Abdomen Mrcp Wo Contrast  Result Date: 04/30/2018 CLINICAL DATA:  Abnormal liver function tests EXAM: MRI ABDOMEN WITHOUT CONTRAST  (INCLUDING MRCP) TECHNIQUE: Multiplanar multisequence MR imaging of the abdomen was performed. Heavily T2-weighted images of the biliary and pancreatic ducts were obtained, and three-dimensional MRCP images were rendered by post processing. COMPARISON:  CT 04/29/2018 FINDINGS: Lower chest:  Lung bases are clear. Hepatobiliary: There is marked loss of signal intensity on the opposed phase imaging (series 9) consistent diffuse fatty infiltration of the liver. There is no intrahepatic biliary duct dilatation. Gallbladder is not distended but there is small amount of pericholecystic fluid and gallbladder wall thickening.  Small amount fluid also extends along the margin of the liver. Caudate lobe mildly enlarged. Recanalization umbilical vein. Common bile duct is normal caliber. No biliary obstruction evident. Normal Pancreas: Normal pancreatic  parenchymal intensity. No pancreatic duct dilatation. No para peripancreatic fluid collections. Spleen: Normal spleen. Adrenals/urinary tract: Adrenal glands and kidneys are normal. Stomach/Bowel: Stomach and limited of the small bowel is unremarkable Vascular/Lymphatic: Abdominal aortic normal caliber. No retroperitoneal periportal lymphadenopathy. Recanalization of the umbilical vein. Musculoskeletal: No aggressive osseous lesion IMPRESSION: 1. Marked loss of signal intensity with in the liver on opposed phase imaging is most consistent with severe hepatic steatosis. 2. Pericolic fluid, mild gallbladder wall thickening and small amount ascites along the margin liver is likely related to hepatic dysfunction. 3. No evidence of biliary obstruction. Common bile duct normal caliber. 4. No clear evidence of acute pancreatitis. 5. Recanalization of the umbilical vein and mild prominence of the caudate lobe suggests early cirrhosis/portal hypertension. Electronically Signed   By: Genevive Bi M.D.   On: 04/30/2018 15:10   Mr 3d Recon At Scanner  Result Date: 04/30/2018 CLINICAL DATA:  Abnormal liver function tests EXAM: MRI ABDOMEN WITHOUT CONTRAST  (INCLUDING MRCP) TECHNIQUE: Multiplanar multisequence MR imaging of the abdomen was performed. Heavily T2-weighted images of the biliary and pancreatic ducts were obtained, and three-dimensional MRCP images were rendered by post processing. COMPARISON:  CT 04/29/2018 FINDINGS: Lower chest:  Lung bases are clear. Hepatobiliary: There is marked loss of signal intensity on the opposed phase imaging (series 9) consistent diffuse fatty infiltration of the liver. There is no intrahepatic biliary duct dilatation. Gallbladder is not distended but there  is small amount of pericholecystic fluid and gallbladder wall thickening. Small amount fluid also extends along the margin of the liver. Caudate lobe mildly enlarged. Recanalization umbilical vein. Common bile duct is normal caliber. No biliary obstruction evident. Normal Pancreas: Normal pancreatic parenchymal intensity. No pancreatic duct dilatation. No para peripancreatic fluid collections. Spleen: Normal spleen. Adrenals/urinary tract: Adrenal glands and kidneys are normal. Stomach/Bowel: Stomach and limited of the small bowel is unremarkable Vascular/Lymphatic: Abdominal aortic normal caliber. No retroperitoneal periportal lymphadenopathy. Recanalization of the umbilical vein. Musculoskeletal: No aggressive osseous lesion IMPRESSION: 1. Marked loss of signal intensity with in the liver on opposed phase imaging is most consistent with severe hepatic steatosis. 2. Pericolic fluid, mild gallbladder wall thickening and small amount ascites along the margin liver is likely related to hepatic dysfunction. 3. No evidence of biliary obstruction. Common bile duct normal caliber. 4. No clear evidence of acute pancreatitis. 5. Recanalization of the umbilical vein and mild prominence of the caudate lobe suggests early cirrhosis/portal hypertension. Electronically Signed   By: Genevive Bi M.D.   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 Request made for diagnostic and therapeutic paracentesis. EXAM: ULTRASOUND GUIDED DIAGNOSTIC AND THERAPEUTIC PARACENTESIS MEDICATIONS: None COMPLICATIONS: None immediate. PROCEDURE: Informed written consent was obtained from the patient after a discussion of the risks, benefits and alternatives to treatment. A timeout was performed prior to the initiation of the procedure. Initial ultrasound scanning demonstrates a small amount of  ascites within the right lower abdominal quadrant. The right lower abdomen was prepped and draped in the usual sterile fashion. 1% lidocaine was used for local anesthesia. Following this, a 19 gauge, 7-cm, Yueh catheter was introduced. An ultrasound image was saved for documentation purposes. The paracentesis was performed. The catheter was removed and a dressing was applied. The patient tolerated the procedure well without immediate post procedural complication. FINDINGS: A total of approximately 280 cc of golden yellow fluid was removed. Samples were  sent to the laboratory as requested by the clinical team. Only minimal ascites was present. IMPRESSION: Successful ultrasound-guided diagnostic and therapeutic paracentesis yielding 280 cc of peritoneal fluid. Read by: Jeananne Rama, PA-C Electronically Signed   By: Judie Petit.  Shick M.D.   On: 04/30/2018 16:02        Scheduled Meds: . chlordiazePOXIDE  25 mg Oral TID   Followed by  . [START ON 05/03/2018] chlordiazePOXIDE  25 mg Oral [id number]   Followed by  . [START ON 05/04/2018] chlordiazePOXIDE  25 mg Oral Daily  . feeding supplement (ENSURE ENLIVE)  237 mL Oral TID BM  . folic acid  1 mg Oral Daily  . ibuprofen  600 mg Oral TID  . lactulose  20 g Oral TID  . multivitamin with minerals  1 tablet Oral Once  . nicotine  21 mg Transdermal Daily  . pantoprazole  40 mg Oral Q0600  . thiamine  100 mg Oral Once   Continuous Infusions:    LOS: 3 days    Time spent: 40 minutes    Ramiro Harvest, MD [facility] Pager [phone]  If 7PM-7AM, please contact night-coverage [email or web address] Password [facility] 05/02/2018, 10:43 AM

## 2018-05-02 NOTE — Plan of Care (Signed)
Patient arrived on [address] as a transfer from ICU.  VSS.  Patients mentation fluctuates, when first arrived on unit appeared to be A&Ox4, slept for a brief period then woke up confused, did not know where he was, was noncompliant with safety measures and had to be reoriented to situation.  Patient with 2 BM's after arriving on [address], continues to take Lactulose.  Walked with physical therapy around entire unit.  Denies anxiety or pain.  Continues on Librium, tremors present, quite unsteady on feet.

## 2018-05-02 NOTE — Evaluation (Signed)
Occupational Therapy Evaluation Patient Details Name: Javier Grimes MRN: [id number] DOB: December 04, 1970 Today's Date: 05/02/2018    History of Present Illness 47 yo male admitted with Acute hepatitis with jaundice. pt currently on CIWA due to ETOH withdrawal   Clinical Impression   PT admitted with acute hepatitis. Pt currently with functional limitiations due to the deficits listed below (see OT problem list). PTA was independent with all adls including driving and working full time. Pt reports wife can take a month off from Javier Grimes to (A) him at home. Pt currently requires mod to min guard (A) with transfers which affect all adls. Pt requires Mod (A) for LB adls at this time due to balance deficits. OT to continue to assess needs throughout this admission. Pt will require 24 (A) upon d/c. If family is unable to provide physical (A) will need to consider higher level care.  Pt will benefit from skilled OT to increase their independence and safety with adls and balance to allow discharge HHOT.     Follow Up Recommendations  Home health OT;Supervision/Assistance - 24 hour    Equipment Recommendations  None recommended by OT    Recommendations for Other Services       Precautions / Restrictions Precautions Precautions: Fall      Mobility Bed Mobility Overal bed mobility: Independent                Transfers Overall transfer level: Needs assistance   Transfers: Sit to/from Stand Sit to Stand: Min assist         General transfer comment: pt requires (A) due to unsteady with standing and tremors noted. pt reports "i am just so weak"    Balance Overall balance assessment: Needs assistance Sitting-balance support: Bilateral upper extremity supported;Feet supported Sitting balance-Leahy Scale: Fair Sitting balance - Comments: pt with functional task demonstrates poor balance   Standing balance support: Bilateral upper extremity supported;During functional  activity Standing balance-Leahy Scale: Poor Standing balance comment: pt with multiple LOB during session and requiring x3 rest breaks                           ADL either performed or assessed with clinical judgement   ADL Overall ADL's : Needs assistance/impaired Eating/Feeding: Set up;Sitting               Upper Body Dressing : Minimal assistance;Sitting   Lower Body Dressing: Moderate assistance;Sit to/from stand Lower Body Dressing Details (indicate cue type and reason): pt requires (A) to thread bil feet into paper pants. pt requires (A) to hook foot into socks. once over the toes pt ablet o pull up socks. pt able to figure fall with LOB lateral x2 .  Toilet Transfer: Minimal Production designer, theatre/television/film Details (indicate cue type and reason): recommend 3n1 use use to lateral LOB with adl task at EOB this session         Functional mobility during ADLs: Moderate assistance;Rolling walker General ADL Comments: pt lacks awareness to balance deficits and bracing on surfaces. pt with posterior LOB upon standing and bracing against bed surface. pt requires cues throughout session to stop and static stand. pt once static able to continue pt with delayed righting reaction.     Vision Baseline Vision/History: No visual deficits       Perception     Praxis      Pertinent Vitals/Pain Pain Assessment: No/denies pain     Hand Dominance Right  Extremity/Trunk Assessment Upper Extremity Assessment Upper Extremity Assessment: Generalized weakness(tremor with incr intensity with functional task)   Lower Extremity Assessment Lower Extremity Assessment: Defer to PT evaluation   Cervical / Trunk Assessment Cervical / Trunk Assessment: Other exceptions(hx of back pain)   Communication Communication Communication: No difficulties   Cognition Arousal/Alertness: Awake/alert Behavior During Therapy: Flat affect Overall Cognitive Status: Impaired/Different from  baseline Area of Impairment: Awareness;Safety/judgement                         Safety/Judgement: Decreased awareness of deficits;Decreased awareness of safety Awareness: Emergent   General Comments: pt lacks awareness to balance deficits   General Comments  VSS, pt does require x3 rest breaks. pt noted to have HR 114 and BP 120/78    Exercises     Shoulder Instructions      Home Living Family/patient expects to be discharged to:: Private residence Living Arrangements: Spouse/significant other Available Help at Discharge: Family;Available 24 hours/day Type of Home: House Home Access: Level entry     Home Layout: One level     Bathroom Shower/Tub: Chief Strategy OfficerTub/shower unit   Bathroom Toilet: Standard     Home Equipment: Walker - 2 wheels;Grab bars - tub/shower;Hand held shower head;Shower seat   Additional Comments: 648 yo son/ 47 yo niece and reports that wife can take FLMA upon d/c home      Prior Functioning/Environment Level of Independence: Independent        Comments: works full time as a Education officer, environmentalsupervisor         OT Problem List: Decreased strength;Decreased activity tolerance;Impaired balance (sitting and/or standing);Decreased coordination;Decreased knowledge of precautions;Decreased cognition;Decreased safety awareness;Decreased knowledge of use of DME or AE;Impaired UE functional use      OT Treatment/Interventions: Self-care/ADL training;Therapeutic exercise;Energy conservation;DME and/or AE instruction;Therapeutic activities;Cognitive remediation/compensation;Balance training;Patient/family education    OT Goals(Current goals can be found in the care plan section) Acute Rehab OT Goals Patient Stated Goal: none stated OT Goal Formulation: With patient Time For Goal Achievement: 05/16/18 Potential to Achieve Goals: Good  OT Frequency: Min 3X/week   Barriers to D/C: Other (comment)(reports wife can be home but not confirmed)          Co-evaluation               AM-PAC PT "6 Clicks" Daily Activity     Outcome Measure Help from another person eating meals?: None Help from another person taking care of personal grooming?: A Little Help from another person toileting, which includes using toliet, bedpan, or urinal?: A Lot Help from another person bathing (including washing, rinsing, drying)?: A Lot Help from another person to put on and taking off regular upper body clothing?: A Lot Help from another person to put on and taking off regular lower body clothing?: A Lot 6 Click Score: 15   End of Session Equipment Utilized During Treatment: Rolling walker;Gait belt Nurse Communication: Mobility status;Precautions  Activity Tolerance: Patient tolerated treatment well Patient left: in chair;with call bell/phone within reach;with chair alarm set;with nursing/sitter in room  OT Visit Diagnosis: Unsteadiness on feet (R26.81);Repeated falls (R29.6);Muscle weakness (generalized) (M62.81)                Time: [id number] OT Time Calculation (min): 33 min Charges:  OT General Charges $OT Visit: 1 Visit OT Evaluation $OT Eval Moderate Complexity: 1 Mod OT Treatments $Self Care/Home Management : 8-22 mins G-Codes:      Javier Grimes, Javier Grimes   OTR/L Pager: [phone]  Office: [phone] .   Javier Grimes 05/02/2018, 7:39 AM

## 2018-05-02 NOTE — Evaluation (Signed)
Physical Therapy Evaluation Patient Details Name: Javier Grimes MRN: [id number] DOB: 1971-10-09 Today's Date: 05/02/2018   History of Present Illness  47 yo male admitted with Acute hepatitis with jaundice. pt currently on CIWA due to ETOH withdrawal. hx of ETOH abuse  Clinical Impression  On eval, pt required Min assist for mobility. He walked ~500 feet with a RW. Pt presents with general weakness and impaired gait/balance. He is currently at risk for falls when mobilizing. Discussed d/c plan-pt plans to return home with family assisting as needed. Will follow during hospital stay.     Follow Up Recommendations Home health PT;Supervision/Assistance - 24 hour    Equipment Recommendations  None recommended by PT    Recommendations for Other Services       Precautions / Restrictions Precautions Precautions: Fall Precaution Comments: tremors Restrictions Weight Bearing Restrictions: No      Mobility  Bed Mobility               General bed mobility comments: oob in recliner  Transfers Overall transfer level: Needs assistance Equipment used: Rolling walker (2 wheeled) Transfers: Sit to/from Stand Sit to Stand: Min assist         General transfer comment: Assist to steady. VCs hand placement. Pt attempted to sit before safely positioned in front of recliner.   Ambulation/Gait Ambulation/Gait assistance: Min assist Gait Distance (Feet): 500 Feet Assistive device: Rolling walker (2 wheeled) Gait Pattern/deviations: Step-through pattern;Trunk flexed     General Gait Details: Assist to stabilize pt throughout ambulation distance. Unsteady.   Stairs            Wheelchair Mobility    Modified Rankin (Stroke Patients Only)       Balance Overall balance assessment: Needs assistance         Standing balance support: Bilateral upper extremity supported Standing balance-Leahy Scale: Poor                               Pertinent  Vitals/Pain Pain Assessment: No/denies pain    Home Living Family/patient expects to be discharged to:: Private residence Living Arrangements: Spouse/significant other Available Help at Discharge: Family;Available 24 hours/day Type of Home: House Home Access: Level entry     Home Layout: One level Home Equipment: Walker - 2 wheels;Grab bars - tub/shower;Hand held shower head;Shower seat Additional Comments: 958 yo son/ 47 yo niece and reports that wife can take FLMA upon d/c home    Prior Function Level of Independence: Independent         Comments: works full time as a Loss adjuster, charteredsupervisor      Hand Dominance   Dominant Hand: Right    Extremity/Trunk Assessment   Upper Extremity Assessment Upper Extremity Assessment: Defer to OT evaluation    Lower Extremity Assessment Lower Extremity Assessment: Generalized weakness    Cervical / Trunk Assessment Cervical / Trunk Assessment: Normal  Communication   Communication: No difficulties  Cognition Arousal/Alertness: Awake/alert Behavior During Therapy: Flat affect Overall Cognitive Status: Impaired/Different from baseline Area of Impairment: Awareness;Safety/judgement                         Safety/Judgement: Decreased awareness of deficits;Decreased awareness of safety            General Comments      Exercises     Assessment/Plan    PT Assessment Patient needs continued PT services  PT Problem List Decreased  balance;Decreased mobility;Decreased strength;Decreased safety awareness       PT Treatment Interventions DME instruction;Gait training;Functional mobility training;Therapeutic activities;Balance training;Patient/family education;Therapeutic exercise    PT Goals (Current goals can be found in the Care Plan section)  Acute Rehab PT Goals Patient Stated Goal: none stated PT Goal Formulation: With patient Time For Goal Achievement: 05/15/18 Potential to Achieve Goals: Good    Frequency Min  3X/week   Barriers to discharge        Co-evaluation               AM-PAC PT "6 Clicks" Daily Activity  Outcome Measure Difficulty turning over in bed (including adjusting bedclothes, sheets and blankets)?: A Little Difficulty moving from lying on back to sitting on the side of the bed? : A Little Difficulty sitting down on and standing up from a chair with arms (e.g., wheelchair, bedside commode, etc,.)?: Unable Help needed moving to and from a bed to chair (including a wheelchair)?: A Little Help needed walking in hospital room?: A Little Help needed climbing 3-5 steps with a railing? : A Little 6 Click Score: 16    End of Session Equipment Utilized During Treatment: Gait belt Activity Tolerance: Patient tolerated treatment well Patient left: in chair;with call bell/phone within reach;with chair alarm set   PT Visit Diagnosis: Muscle weakness (generalized) (M62.81);Difficulty in walking, not elsewhere classified (R26.2);Unsteadiness on feet (R26.81)    Time: 5409-8119 PT Time Calculation (min) (ACUTE ONLY): 10 min   Charges:   PT Evaluation $PT Eval Moderate Complexity: 1 Mod     PT G Codes:        Rebeca Alert, MPT Pager: [phone]

## 2018-05-03 DIAGNOSIS — D649 Anemia, unspecified: Secondary | ICD-10-CM | POA: Diagnosis not present

## 2018-05-03 DIAGNOSIS — K7200 Acute and subacute hepatic failure without coma: Secondary | ICD-10-CM | POA: Diagnosis not present

## 2018-05-03 DIAGNOSIS — K859 Acute pancreatitis without necrosis or infection, unspecified: Secondary | ICD-10-CM | POA: Diagnosis not present

## 2018-05-03 DIAGNOSIS — K831 Obstruction of bile duct: Secondary | ICD-10-CM

## 2018-05-03 DIAGNOSIS — F1010 Alcohol abuse, uncomplicated: Secondary | ICD-10-CM | POA: Diagnosis not present

## 2018-05-03 DIAGNOSIS — K7010 Alcoholic hepatitis without ascites: Secondary | ICD-10-CM

## 2018-05-03 DIAGNOSIS — F10230 Alcohol dependence with withdrawal, uncomplicated: Secondary | ICD-10-CM | POA: Diagnosis not present

## 2018-05-03 DIAGNOSIS — G8929 Other chronic pain: Secondary | ICD-10-CM

## 2018-05-03 LAB — CBC WITH DIFFERENTIAL/PLATELET
BASOS ABS: 0.1 10*3/uL (ref 0.0–0.1)
Basophils Relative: 1 %
Eosinophils Absolute: 0.3 10*3/uL (ref 0.0–0.7)
Eosinophils Relative: 4 %
HEMATOCRIT: 33.7 % — AB (ref 39.0–52.0)
HEMOGLOBIN: 11.2 g/dL — AB (ref 13.0–17.0)
LYMPHS PCT: 20 %
Lymphs Abs: 1.6 10*3/uL (ref 0.7–4.0)
MCH: 41.2 pg — ABNORMAL HIGH (ref 26.0–34.0)
MCHC: 33.2 g/dL (ref 30.0–36.0)
MCV: 123.9 fL — ABNORMAL HIGH (ref 78.0–100.0)
MONOS PCT: 8 %
Monocytes Absolute: 0.6 10*3/uL (ref 0.1–1.0)
NEUTROS ABS: 5.4 10*3/uL (ref 1.7–7.7)
Neutrophils Relative %: 67 %
Platelets: 181 10*3/uL (ref 150–400)
RBC: 2.72 MIL/uL — AB (ref 4.22–5.81)
RDW: 18.2 % — ABNORMAL HIGH (ref 11.5–15.5)
WBC: 8 10*3/uL (ref 4.0–10.5)

## 2018-05-03 LAB — BASIC METABOLIC PANEL
Anion gap: 8 (ref 5–15)
BUN: 8 mg/dL (ref 6–20)
CHLORIDE: 109 mmol/L (ref 98–111)
CO2: 20 mmol/L — ABNORMAL LOW (ref 22–32)
Calcium: 7.7 mg/dL — ABNORMAL LOW (ref 8.9–10.3)
Creatinine, Ser: <0.3 mg/dL — ABNORMAL LOW (ref 0.61–1.24)
GLUCOSE: 89 mg/dL (ref 70–99)
POTASSIUM: 3.4 mmol/L — AB (ref 3.5–5.1)
Sodium: 137 mmol/L (ref 135–145)

## 2018-05-03 LAB — HEPATIC FUNCTION PANEL
ALBUMIN: 1.7 g/dL — AB (ref 3.5–5.0)
ALT: 49 U/L — AB (ref 0–44)
AST: 192 U/L — ABNORMAL HIGH (ref 15–41)
Alkaline Phosphatase: 291 U/L — ABNORMAL HIGH (ref 38–126)
BILIRUBIN INDIRECT: 7.3 mg/dL — AB (ref 0.3–0.9)
Bilirubin, Direct: 15.4 mg/dL — ABNORMAL HIGH (ref 0.0–0.2)
TOTAL PROTEIN: 5.2 g/dL — AB (ref 6.5–8.1)
Total Bilirubin: 22.7 mg/dL (ref 0.3–1.2)

## 2018-05-03 LAB — AMMONIA: AMMONIA: 112 umol/L — AB (ref 9–35)

## 2018-05-03 LAB — PROTIME-INR
INR: 1.17
Prothrombin Time: 14.8 seconds (ref 11.4–15.2)

## 2018-05-03 LAB — MAGNESIUM: Magnesium: 2.4 mg/dL (ref 1.7–2.4)

## 2018-05-03 MED ORDER — LACTULOSE 10 GM/15ML PO SOLN
30.0000 g | Freq: Three times a day (TID) | ORAL | Status: DC
  Administered 2018-05-03 (×3): 30 g via ORAL
  Filled 2018-05-03 (×3): qty 60

## 2018-05-03 MED ORDER — POTASSIUM CHLORIDE CRYS ER 20 MEQ PO TBCR
40.0000 meq | EXTENDED_RELEASE_TABLET | Freq: Once | ORAL | Status: AC
  Administered 2018-05-03: 40 meq via ORAL
  Filled 2018-05-03: qty 2

## 2018-05-03 MED ORDER — ADULT MULTIVITAMIN W/MINERALS CH
1.0000 | ORAL_TABLET | Freq: Every day | ORAL | Status: DC
  Administered 2018-05-03 – 2018-05-06 (×4): 1 via ORAL
  Filled 2018-05-03 (×4): qty 1

## 2018-05-03 NOTE — Progress Notes (Signed)
PROGRESS NOTE    Javier Grimes  WUJ:[id number] DOB: 1971/08/14 DOA: 04/29/2018 PCP: Patient, No Pcp Per   Brief Narrative:  Javier Grimes is a 47 y.o. male with stable alcohol abuse presents to the ER with complaints of having jaundice noticed over the last 2 weeks.  Patient has been having persistent diarrhea over the last 2 weeks feeling weak and abdomen getting mildly distended.  Patient also noticed lower extremity edema.  Since patient had persistent jaundice noticed over the last 2 weeks patient came to the ER.  Patient takes ibuprofen for his chronic low back pain for which patient also had surgery.  Denies taking Tylenol.  Admits to drinking alcohol every day lasting 5 hours prior to admission.  ED Course: In the ER patient on exam has jaundice and with tremors from withdrawal and bilirubin is around 19.  Hemoglobin is around 12.  CT abdomen and pelvis done shows hepatomegaly and some inflammation around the pancreas.  Lipase is mildly elevated.  AST was 438 ALT 62.  Patient admitted for acute hepatitis.    Assessment & Plan:   Principal Problem:   Acute alcoholic hepatitis Active Problems:   Hyperbilirubinemia   Acute hepatic encephalopathy   Jaundice   Transaminitis   Acute hepatitis   Diarrhea   Alcohol withdrawal (HCC)   Alcohol abuse   Ascites   Folate deficiency   Left-sided low back pain without sciatica  1 acute alcoholic hepatitis/jaundice/transaminitis/hyperbilirubinemia Likely secondary to an acute alcoholic hepatitis.  Acute viral hepatitis panel negative.  MRCP/CT abdomen and pelvis negative for any biliary obstruction.  MRCP negative for acute pancreatitis.  LFTs still significantly elevated.  Bilirubin starting to slowly trend back down however still elevated at 22.7.  INR decreasing and currently at 1.17.  Discriminant function score < 32 per GI and no indication for steroids at this time.  Patient denies any excessive use of Tylenol.  Tylenol level was  unremarkable.  GI following and appreciate input and recommendations.  2.  Hepatic encephalopathy Per family patient with increasing confusion.  CT head which was obtained was negative for any acute abnormalities.  Ammonia level obtained on 05/01/2018 was 75.  Ammonia level today at 112.  Increase lactulose to 30 g p.o. 3 times daily.  May need Xifaxan however will defer to GI.  GI following and I appreciate input and recommendations.  3.  Alcohol withdrawal/alcohol abuse On admission patient noted to have significant tremors and tachycardia from alcohol withdrawal.  Tremors improved.  Patient states drinks about 1/5 of vodka on a daily basis for over 20 to 30 years.  Continue Librium detox protocol.  Continue thiamine, folic acid, multivitamin.  IV Ativan as needed.  Alcohol cessation stressed to patient.    4.  Ascites Concern for SBP.  Status post ultrasound-guided therapeutic and diagnostic paracentesis yielding 280 cc of golden yellow fluid.  Patient with no abdominal pain.  Ascitic fluid with a glucose level of 89, albumin of less than 1, protein of less than 3, WBC of 60.  Ascitic fluid cultures preliminary readings with no organisms seen.  Patient currently afebrile.  Chest x-ray was negative as patient was noted to have a low-grade temperature on 05/01/2018.  Blood cultures pending.  IV Levaquin has been discontinued.  No need for antibiotics at this time.  GI following.  5.  Diarrhea Patient with no recent antibiotics.??  Pancreatic insufficiency.  Diarrhea improved.  C. difficile PCR and stool studies discontinued.  Patient on Imodium as needed.  Patient has been started on lactulose for probable hepatic encephalopathy.  Monitor stool output.   6.  Hypokalemia  Replete.  Magnesium level at 2.4.   7.  Macrocytic anemia/folate deficiency Likely alcohol induced.  Patient with no overt bleeding.  Anemia panel with a elevated ferritin level.  Folate level is decreased at 3.2.  Vitamin B12  levels are elevated.  Continue folic acid.  Continue PPI.  Follow H&H.  Transfusion threshold hemoglobin less than 7.  GI following.    8.  Peripancreatic inflammation per CT scan MRCP with no evidence of biliary obstruction or cholecystitis.  MRCP with no evidence of acute pancreatitis.  GI consulted and following.  9 elevated ferritin ??  Hemochromatosis.  Labs for hemochromatosis pending per GI. Follow.  10.  Protein calorie malnutrition Likely secondary to alcohol abuse/alcohol dependence.  Nutritional supplementation.  Dietitian following.    11. low back pain/bilateral lower extremity weakness Patient with complaints of left-sided low back pain and lower extremity weakness.  Patient denies any shooting pain down the left side of his leg.  Patient with some complaints of numbness on the bottom of his feet.  Patient states had surgery done on his back about 6 to 12 months ago.  CT abdomen and pelvis done on admission with L4-5 probable disc extrusion which may affect the transversing left L5 nerve.  Will need to follow-up with his neurosurgeon in the outpatient setting.    DVT prophylaxis: SCDs Code Status: Full Family Communication: Updated patient, wife, sister at bedside. Disposition Plan: Likely home with home health once Librium withdrawal protocol is completed and when okay with gastroenterology.     Consultants:   Gastroenterology: Dr. Lavon Paganini 04/30/2018  Procedures:   CT abdomen and pelvis 04/29/2018  MRCP abdomen 04/30/2018  Ultrasound-guided paracentesis --280 cc of golden yellow fluid per interventional radiology Darrell Allred, PA 04/30/2018  CT head 05/01/2018  Chest x-ray 05/01/2018  Antimicrobials:   IV Levaquin 04/30/2018>>>>05/02/2018   Subjective: Patient just finished ambulating with physical therapy.  Patient alert to self place and time.  Per family still confused however slightly improved than yesterday.  No chest pain.  No shortness of breath.     Objective: Vitals:   05/02/18 1100 05/02/18 1212 05/02/18 2033 05/03/18 0500  BP: 107/77 107/75 100/65   Pulse: 89 87 88   Resp: 14 (!) 22 18   Temp:  98.2 F (36.8 C) 98.3 F (36.8 C)   TempSrc:  Oral Oral   SpO2: 99% 100% 97%   Weight:    77.2 kg (170 lb 4.8 oz)  Height:        Intake/Output Summary (Last 24 hours) at 05/03/2018 0950 Last data filed at 05/03/2018 0814 Gross per 24 hour  Intake 360 ml  Output -  Net 360 ml   Filed Weights   04/30/18 0437 05/01/18 0500 05/03/18 0500  Weight: 80.6 kg (177 lb 11.1 oz) 84.3 kg (185 lb 13.6 oz) 77.2 kg (170 lb 4.8 oz)    Examination:  General exam: Jaundiced.  Less Tremors Respiratory system: Lungs clear to auscultation bilaterally.  No wheezes, no crackles, no rhonchi.  Normal respiratory effort.  Cardiovascular system: Regular rate rhythm no murmurs rubs or gallops.  No JVD.  No lower extremity edema.  Gastrointestinal system: Abdomen is less distended, soft, nontender to palpation.  Hepatomegaly.  No rebound.  No guarding.   Central nervous system: Alert and oriented to self and place. No focal neurological deficits. Extremities: Symmetric 5 x 5  power. Skin: No rashes, lesions or ulcers Psychiatry: Judgement and insight appear-fair. Mood & affect appropriate.     Data Reviewed: I have personally reviewed following labs and imaging studies  CBC: Recent Labs  Lab 04/29/18 1910 04/30/18 0319 05/01/18 0537 05/02/18 0406 05/03/18 0424  WBC 8.7 7.2 7.6 9.0 8.0  NEUTROABS  --  5.1  --  6.8 5.4  HGB 12.3* 10.5* 10.8* 11.8* 11.2*  HCT 34.9* 30.4* 31.7* 34.8* 33.7*  MCV 114.8* 116.0* 119.2* 120.0* 123.9*  PLT 197 200 163 188 181   Basic Metabolic Panel: Recent Labs  Lab 04/29/18 1910 04/30/18 0319 04/30/18 0656 05/01/18 0537 05/02/18 0406 05/03/18 0424  NA 139 139  --  135 137 137  K 3.3* 3.0*  --  3.5 3.7 3.4*  CL 105 107  --  106 109 109  CO2 24 24  --  23 22 20*  GLUCOSE 108* 89  --  87 95 89  BUN 8 7   --  6 8 8   CREATININE <0.30* <0.30*  --  <0.30* <0.30* <0.30*  CALCIUM 7.8* 7.1*  --  7.4* 7.8* 7.7*  MG  --   --  1.7 2.4 2.3 2.4   GFR: CrCl cannot be calculated (This lab value cannot be used to calculate CrCl because it is not a number: <0.30). Liver Function Tests: Recent Labs  Lab 04/29/18 1910 04/30/18 0319 05/01/18 0537 05/02/18 0406 05/03/18 0424  AST 438* 389* 326* 255* 192*  ALT 66* 60* 57* 53* 49*  ALKPHOS 412* 348* 330* 333* 291*  BILITOT 19.8* 17.0* 20.3* 23.6* 22.7*  PROT 5.9* 5.1* 5.1* 5.6* 5.2*  ALBUMIN 1.9* 1.7* 1.6* 1.7* 1.7*   Recent Labs  Lab 04/29/18 1910  LIPASE 76*   Recent Labs  Lab 05/01/18 1101 05/02/18 0406 05/03/18 0424  AMMONIA 75* 67* 112*   Coagulation Profile: Recent Labs  Lab 04/29/18 2149 04/30/18 0319 05/01/18 0537 05/02/18 0406 05/03/18 0833  INR 1.13 1.21 1.26 1.19 1.17   Cardiac Enzymes: No results for input(s): CKTOTAL, CKMB, CKMBINDEX, TROPONINI in the last 168 hours. BNP (last 3 results) No results for input(s): PROBNP in the last 8760 hours. HbA1C: No results for input(s): HGBA1C in the last 72 hours. CBG: No results for input(s): GLUCAP in the last 168 hours. Lipid Profile: No results for input(s): CHOL, HDL, LDLCALC, TRIG, CHOLHDL, LDLDIRECT in the last 72 hours. Thyroid Function Tests: No results for input(s): TSH, T4TOTAL, FREET4, T3FREE, THYROIDAB in the last 72 hours. Anemia Panel: No results for input(s): VITAMINB12, FOLATE, FERRITIN, TIBC, IRON, RETICCTPCT in the last 72 hours. Sepsis Labs: No results for input(s): PROCALCITON, LATICACIDVEN in the last 168 hours.  Recent Results (from the past 240 hour(s))  MRSA PCR Screening     Status: None   Collection Time: 04/30/18 12:37 AM  Result Value Ref Range Status   MRSA by PCR NEGATIVE NEGATIVE Final    Comment:        The GeneXpert MRSA Assay (FDA approved for NASAL specimens only), is one component of a comprehensive MRSA colonization surveillance  program. It is not intended to diagnose MRSA infection nor to guide or monitor treatment for MRSA infections. Performed at [facility], [address] [place], [place] [address]   Body fluid culture     Status: None (Preliminary result)   Collection Time: 04/30/18  3:38 PM  Result Value Ref Range Status   Specimen Description   Final    ASCITIC Performed at  [facility], [address] [place], [place] [address]    Special Requests   Final    NONE Performed at [facility], [address] [place], [place] [address]    Gram Stain   Final    MODERATE WBC PRESENT, PREDOMINANTLY MONONUCLEAR NO ORGANISMS SEEN    Culture   Final    NO GROWTH 3 DAYS Performed at [facility], [address] [place], [place] [address]    Report Status PENDING  Incomplete  Culture, blood (Routine X 2) w Reflex to ID Panel     Status: None (Preliminary result)   Collection Time: 05/01/18  8:01 AM  Result Value Ref Range Status   Specimen Description   Final    BLOOD RIGHT ANTECUBITAL Performed at [facility], [address] [place], [place] [address]    Special Requests   Final    BOTTLES DRAWN AEROBIC ONLY Blood Culture results may not be optimal due to an excessive volume of blood received in culture bottles Performed at [facility], [address] [place], [place] [address]    Culture   Final    NO GROWTH 2 DAYS Performed at [facility], [address] [place], [place] [address]    Report Status PENDING  Incomplete  Culture, blood (Routine X 2) w Reflex to ID Panel     Status: None (Preliminary result)   Collection Time: 05/01/18  8:02 AM  Result Value Ref Range Status   Specimen Description   Final    BLOOD RIGHT HAND Performed at [facility], [address] [place], [place] [address]    Special Requests   Final    BOTTLES DRAWN AEROBIC ONLY Blood Culture adequate  volume Performed at [facility], [address] [place], [place] [address]    Culture   Final    NO GROWTH 2 DAYS Performed at [facility], [address] [place], [place] [address]    Report Status PENDING  Incomplete         Radiology Studies: Dg Chest 2 View  Result Date: 05/01/2018 CLINICAL DATA:  Fever. EXAM: CHEST - 2 VIEW COMPARISON:  December 13, 2016 FINDINGS: Tiny pleural effusions. The heart, hila, mediastinum, lungs, and pleura are otherwise normal. IMPRESSION: Tiny pleural effusions.  No other abnormalities. Electronically Signed   By: Gerome Samavid  Williams III M.D   On: 05/01/2018 11:23   Ct Head Wo Contrast  Result Date: 05/01/2018 CLINICAL DATA:  47 year old male with history of encephalopathy. Alcohol withdrawal. EXAM: CT HEAD WITHOUT CONTRAST TECHNIQUE: Contiguous axial images were obtained from the base of the skull through the vertex without intravenous contrast. COMPARISON:  Head CT 02/15/2018. FINDINGS: Brain: Mild cerebral and cerebellar atrophy. Patchy areas of decreased attenuation are noted throughout the deep and periventricular white matter of the cerebral hemispheres bilaterally, compatible with chronic microvascular ischemic disease. No evidence of acute infarction, hemorrhage, hydrocephalus, extra-axial collection or mass lesion/mass effect. Vascular: No hyperdense vessel or unexpected calcification. Skull: Normal. Negative for fracture or focal lesion. Sinuses/Orbits: Extensive mucoperiosteal thickening noted in the paranasal sinuses, most severe in the maxillary sinuses bilaterally and in the right sphenoid sinus. Right maxillary sinus is completely opacified. Mastoids are clear. Other: There are no aggressive appearing lytic or blastic lesions noted in the visualized portions of the skeleton. IMPRESSION: 1. No acute intracranial abnormalities. 2. Mild cerebral and cerebellar atrophy with chronic microvascular ischemic  changes in the  cerebral white matter, as above. 3. Severe chronic paranasal sinus disease, as discussed above. Electronically Signed   By: Trudie Reed M.D.   On: 05/01/2018 12:02        Scheduled Meds: . chlordiazePOXIDE  25 mg Oral BH-qamhs   Followed by  . [START ON 05/04/2018] chlordiazePOXIDE  25 mg Oral Daily  . feeding supplement (ENSURE ENLIVE)  237 mL Oral TID BM  . folic acid  1 mg Oral Daily  . ibuprofen  600 mg Oral TID  . lactulose  30 g Oral TID  . multivitamin with minerals  1 tablet Oral Once  . nicotine  21 mg Transdermal Daily  . pantoprazole  40 mg Oral Q0600  . thiamine  100 mg Oral Once   Continuous Infusions:    LOS: 4 days    Time spent: 35 minutes    Ramiro Harvest, MD [facility] Pager [phone]  If 7PM-7AM, please contact night-coverage [email or web address] Password [email or web address] 05/03/2018, 9:50 AM

## 2018-05-03 NOTE — Progress Notes (Signed)
Physical Therapy Treatment Patient Details Name: Javier CityRonald Grimes MRN: [id number] DOB: 02-01-1971 Today's Date: 05/03/2018    History of Present Illness 47 yo male admitted with Acute hepatitis with jaundice. pt currently on CIWA due to ETOH withdrawal. hx of ETOH abuse    PT Comments    Progressing slowly with mobility. Pt remains unsteady and at risk for falls. He will need to continue RW use for ambulation safety.    Follow Up Recommendations  Home health PT;Supervision/Assistance - 24 hour     Equipment Recommendations  None recommended by PT    Recommendations for Other Services       Precautions / Restrictions Precautions Precautions: Fall Precaution Comments: tremors Restrictions Weight Bearing Restrictions: No    Mobility  Bed Mobility Overal bed mobility: Needs Assistance Bed Mobility: Supine to Sit;Sit to Supine     Supine to sit: Min guard;HOB elevated Sit to supine: Min guard;HOB elevated   General bed mobility comments: close guard for safety.   Transfers Overall transfer level: Needs assistance Equipment used: Rolling walker (2 wheeled) Transfers: Sit to/from Stand Sit to Stand: Min assist         General transfer comment: Assist to steady. VCs hand placement.   Ambulation/Gait Ambulation/Gait assistance: Min assist Gait Distance (Feet): 250 Feet Assistive device: Rolling walker (2 wheeled) Gait Pattern/deviations: Step-through pattern;Decreased stride length;Trunk flexed     General Gait Details: Assist to stabilize pt throughout ambulation distance. Unsteady. Shaky/tremors. Pt declined to ambulate any futher due to fatigue.    Stairs             Wheelchair Mobility    Modified Rankin (Stroke Patients Only)       Balance Overall balance assessment: Needs assistance         Standing balance support: Bilateral upper extremity supported Standing balance-Leahy Scale: Poor Standing balance comment: needs RW for ambulation  safety                            Cognition Arousal/Alertness: Awake/alert Behavior During Therapy: Flat affect Overall Cognitive Status: Impaired/Different from baseline Area of Impairment: Awareness;Safety/judgement                         Safety/Judgement: Decreased awareness of deficits;Decreased awareness of safety            Exercises      General Comments        Pertinent Vitals/Pain Pain Assessment: No/denies pain    Home Living                      Prior Function            PT Goals (current goals can now be found in the care plan section) Progress towards PT goals: Progressing toward goals    Frequency    Min 3X/week      PT Plan Current plan remains appropriate    Co-evaluation              AM-PAC PT "6 Clicks" Daily Activity  Outcome Measure  Difficulty turning over in bed (including adjusting bedclothes, sheets and blankets)?: A Little Difficulty moving from lying on back to sitting on the side of the bed? : A Little Difficulty sitting down on and standing up from a chair with arms (e.g., wheelchair, bedside commode, etc,.)?: Unable Help needed moving to and from a bed to chair (including a  wheelchair)?: A Little Help needed walking in hospital room?: A Little Help needed climbing 3-5 steps with a railing? : A Lot 6 Click Score: 15    End of Session Equipment Utilized During Treatment: Gait belt Activity Tolerance: Patient limited by fatigue Patient left: in bed;with call bell/phone within reach;with bed alarm set;with family/visitor present   PT Visit Diagnosis: Muscle weakness (generalized) (M62.81);Difficulty in walking, not elsewhere classified (R26.2);Unsteadiness on feet (R26.81)     Time: 1324-4010 PT Time Calculation (min) (ACUTE ONLY): 10 min  Charges:  $Gait Training: 8-22 mins                    G Codes:         Rebeca Alert, MPT Pager: [phone]

## 2018-05-03 NOTE — Progress Notes (Signed)
Initial Nutrition Assessment  DOCUMENTATION CODES:   Not applicable  INTERVENTION:   Monitor magnesium, potassium, and phosphorus daily for at least 3 days, MD to replete as needed, as pt is at risk for refeeding syndrome given low K levels and poor PO intake PTA.  Continue Ensure Enlive po BID, each supplement provides 350 kcal and 20 grams of protein Provide Multivitamin with minerals daily  NUTRITION DIAGNOSIS:   Increased nutrient needs related to chronic illness(ETOH abuse) as evidenced by estimated needs.  GOAL:   Patient will meet greater than or equal to 90% of their needs  MONITOR:   PO intake, Supplement acceptance, Labs, Weight trends, I & O's  REASON FOR ASSESSMENT:   Consult Assessment of nutrition requirement/status  ASSESSMENT:   47 y.o. male with stable alcohol abuse presents to the ER with complaints of having jaundice noticed over the last 2 weeks.  Patient has been having persistent diarrhea over the last 2 weeks feeling weak and abdomen getting mildly distended.  Patient also noticed lower extremity edema.  Patient on regular diet and consuming 100% of meals now that mental status is improving. Pt is drinking Ensure supplements as well. RD to add daily MVI.   Per chart review, pt has lost 31 lb since 4/29 (15% wt loss x 2.5 months, significant for time frame).   Medications: Folic acid tablet daily Labs reviewed: Low K  NUTRITION - FOCUSED PHYSICAL EXAM:  Nutrition focused physical exam shows no sign of depletion of muscle mass or body fat.  Diet Order:   Diet Order           Diet regular Room service appropriate? Yes; Fluid consistency: Thin  Diet effective now          EDUCATION NEEDS:   Not appropriate for education at this time  Skin:  Skin Assessment: Reviewed RN Assessment  Last BM:  7/14  Height:   Ht Readings from Last 1 Encounters:  04/29/18 6' (1.829 m)    Weight:   Wt Readings from Last 1 Encounters:  05/03/18 170  lb 4.8 oz (77.2 kg)    Ideal Body Weight:  80.9 kg  BMI:  Body mass index is 23.1 kg/m.  Estimated Nutritional Needs:   Kcal:  2300-2500  Protein:  110-120g  Fluid:  2L/day   Tilda FrancoLindsey Micharl Helmes, MS, RD, LDN [facility] Clinical Dietitian Pager: [phone] After Hours Pager: [phone]

## 2018-05-03 NOTE — Progress Notes (Signed)
  Progress Note   Subjective  Chief Complaint: Alcoholic hepatitis  Laying in bed, just returned from the bathroom with a bowel movement, reports 3 this morning already.  Per family he has been stubborn and ornery today which is "normal when he stops drinking".  Patient would like to change into a T-shirt, but this is his only complaint.  Oriented to person and place but not day.  Denies any new complaints.   Objective   Vital signs in last 24 hours: Temp:  [98.2 F (36.8 C)-98.3 F (36.8 C)] 98.3 F (36.8 C) (07/14 2033) Pulse Rate:  [87-89] 88 (07/14 2033) Resp:  [14-22] 18 (07/14 2033) BP: (100-107)/(65-77) 100/65 (07/14 2033) SpO2:  [97 %-100 %] 97 % (07/14 2033) Weight:  [170 lb 4.8 oz (77.2 kg)] 170 lb 4.8 oz (77.2 kg) (07/15 0500) Last BM Date: 05/02/18 General:    white male in NAD Heart:  Regular rate and rhythm; no murmurs Lungs: Respirations even and unlabored, lungs CTA bilaterally Abdomen:  Soft, nontender and moderately distended. Normal bowel sounds. Extremities:  Without edema. Neurologic:  Alert and oriented,  grossly normal neurologically. Psych:  Cooperative. Normal mood and affect.  Intake/Output from previous day: 07/14 0701 - 07/15 0700 In: 240 [P.O.:240] Out: -  Intake/Output this shift: Total I/O In: 120 [P.O.:120] Out: -   Lab Results: Recent Labs    05/01/18 0537 05/02/18 0406 05/03/18 0424  WBC 7.6 9.0 8.0  HGB 10.8* 11.8* 11.2*  HCT 31.7* 34.8* 33.7*  PLT 163 188 181   BMET Recent Labs    05/01/18 0537 05/02/18 0406 05/03/18 0424  NA 135 137 137  K 3.5 3.7 3.4*  CL 106 109 109  CO2 23 22 20*  GLUCOSE 87 95 89  BUN 6 8 8  CREATININE <0.30* <0.30* <0.30*  CALCIUM 7.4* 7.8* 7.7*   Hepatic Function Latest Ref Rng & Units 05/03/2018 05/02/2018 05/01/2018  Total Protein 6.5 - 8.1 g/dL 5.2(L) 5.6(L) 5.1(L)  Albumin 3.5 - 5.0 g/dL 1.7(L) 1.7(L) 1.6(L)  AST 15 - 41 U/L 192(H) 255(H) 326(H)  ALT 0 - 44 U/L 49(H) 53(H) 57(H)  Alk  Phosphatase 38 - 126 U/L 291(H) 333(H) 330(H)  Total Bilirubin 0.3 - 1.2 mg/dL 22.7(HH) 23.6(HH) 20.3(HH)  Bilirubin, Direct 0.0 - 0.2 mg/dL 15.4(H) 14.1(H) 12.6(H)   PT/INR Recent Labs    05/02/18 0406 05/03/18 0833  LABPROT 15.0 14.8  INR 1.19 1.17    Studies/Results: Ct Head Wo Contrast  Result Date: 05/01/2018 CLINICAL DATA:  47-year-old male with history of encephalopathy. Alcohol withdrawal. EXAM: CT HEAD WITHOUT CONTRAST TECHNIQUE: Contiguous axial images were obtained from the base of the skull through the vertex without intravenous contrast. COMPARISON:  Head CT 02/15/2018. FINDINGS: Brain: Mild cerebral and cerebellar atrophy. Patchy areas of decreased attenuation are noted throughout the deep and periventricular white matter of the cerebral hemispheres bilaterally, compatible with chronic microvascular ischemic disease. No evidence of acute infarction, hemorrhage, hydrocephalus, extra-axial collection or mass lesion/mass effect. Vascular: No hyperdense vessel or unexpected calcification. Skull: Normal. Negative for fracture or focal lesion. Sinuses/Orbits: Extensive mucoperiosteal thickening noted in the paranasal sinuses, most severe in the maxillary sinuses bilaterally and in the right sphenoid sinus. Right maxillary sinus is completely opacified. Mastoids are clear. Other: There are no aggressive appearing lytic or blastic lesions noted in the visualized portions of the skeleton. IMPRESSION: 1. No acute intracranial abnormalities. 2. Mild cerebral and cerebellar atrophy with chronic microvascular ischemic changes in the cerebral white matter,   as above. 3. Severe chronic paranasal sinus disease, as discussed above. Electronically Signed   By: Daniel  Entrikin M.D.   On: 05/01/2018 12:02    Assessment / Plan:   Assessment: 1.  Alcoholic hepatitis with marked cholestasis: Severe steatosis/possible early cirrhosis on imaging with recanalization of the umbilical vein, MDF <32-no  indication for steroids, bili 23.6--20.3 overnight, INR trending down, diagnostic paracentesis negative for SBP consistent with portal hypertension with low albumin, continue on lactulose for hepatic encephalopathy 2.  Hyper ferritin anemia: HFE gene pending 3.  Mild pancreatitis?  CT scan 7/11  Plan: 1.  Continue lactulose 2.  Continue other supportive measures today 3.  Discussed patient should continue to wear gown as this makes it easier for our staff to access his IV  4.  Please await any further recommendations from Dr. Pyrtle later today  Thank you for your kind consultation, we will continue to follow along.    LOS: 4 days   Jennifer Lynne Lemmon  05/03/2018, 10:37 AM  Pager # [phone] 

## 2018-05-03 NOTE — Progress Notes (Addendum)
Occupational Therapy Treatment Patient Details Name: Javier Grimes MRN: [id number] DOB: 1971-08-07 Today's Date: 05/03/2018    History of present illness 47 yo male admitted with Acute hepatitis with jaundice. pt currently on CIWA due to ETOH withdrawal. hx of ETOH abuse   OT comments  Pt progressing towards OT goals and is motivated to work with therapy. Pt continues to demonstrate decreased dynamic balance and decreased activity tolerance, requiring minA for room and hallway level functional mobility using RW, and requiring min-modA during completion of toileting and standing grooming ADLs. Feel POC remains appropriate at this time. Will continue to follow acutely to progress pt towards established OT goals.   Follow Up Recommendations  Home health OT;Supervision/Assistance - 24 hour    Equipment Recommendations  None recommended by OT          Precautions / Restrictions Precautions Precautions: Fall Precaution Comments: tremors Restrictions Weight Bearing Restrictions: No       Mobility Bed Mobility Overal bed mobility: Needs Assistance Bed Mobility: Supine to Sit     Supine to sit: Min guard;HOB elevated     General bed mobility comments: close guard for safety.   Transfers Overall transfer level: Needs assistance Equipment used: Rolling walker (2 wheeled) Transfers: Sit to/from Stand Sit to Stand: Min assist         General transfer comment: Assist to steady. VCs hand placement.     Balance Overall balance assessment: Needs assistance Sitting-balance support: Bilateral upper extremity supported;Feet supported Sitting balance-Leahy Scale: Fair Sitting balance - Comments: pt with functional task demonstrates poor balance   Standing balance support: Bilateral upper extremity supported Standing balance-Leahy Scale: Poor Standing balance comment: needs RW for ambulation safety; reliant on UE forearm support during grooming ADLs                            ADL either performed or assessed with clinical judgement   ADL Overall ADL's : Needs assistance/impaired     Grooming: Minimal assistance;Standing Grooming Details (indicate cue type and reason): pt bearing wt on forearms for standing balance during task completion                  Toilet Transfer: Minimal assistance;RW;Regular Toilet;Grab bars;Ambulation   Toileting- Clothing Manipulation and Hygiene: Minimal assistance;Sit to/from stand Toileting - Clothing Manipulation Details (indicate cue type and reason): assist for clothing management; pt performing peri-care while seated using lateral leans      Functional mobility during ADLs: Minimal assistance;Rolling walker General ADL Comments: pt motivated to work with therapy; requesting to ambulate in hallway during this session with pt completing using RW and overall minA. Began discussion regarding activity progression after return home; pt completing toileting prior to transfer to recliner and setup for lunch      Vision       Perception     Praxis      Cognition Arousal/Alertness: Awake/alert Behavior During Therapy: Flat affect Overall Cognitive Status: Impaired/Different from baseline Area of Impairment: Awareness;Safety/judgement                         Safety/Judgement: Decreased awareness of deficits;Decreased awareness of safety Awareness: Emergent   General Comments: pt lacks awareness to balance deficits and current need for assist         Exercises     Shoulder Instructions       General Comments      Pertinent Vitals/  Pain       Pain Assessment: Faces Faces Pain Scale: Hurts a little bit Pain Location: back  Pain Descriptors / Indicators: Grimacing;Sore Pain Intervention(s): Monitored during session  Home Living                                          Prior Functioning/Environment              Frequency  Min 3X/week        Progress  Toward Goals  OT Goals(current goals can now be found in the care plan section)  Progress towards OT goals: Progressing toward goals  Acute Rehab OT Goals Patient Stated Goal: regain independence OT Goal Formulation: With patient Time For Goal Achievement: 05/16/18 Potential to Achieve Goals: Good  Plan Discharge plan remains appropriate    Co-evaluation                 AM-PAC PT "6 Clicks" Daily Activity     Outcome Measure   Help from another person eating meals?: None Help from another person taking care of personal grooming?: A Little Help from another person toileting, which includes using toliet, bedpan, or urinal?: A Lot Help from another person bathing (including washing, rinsing, drying)?: A Lot Help from another person to put on and taking off regular upper body clothing?: A Lot Help from another person to put on and taking off regular lower body clothing?: A Lot 6 Click Score: 15    End of Session Equipment Utilized During Treatment: Rolling walker;Gait belt  OT Visit Diagnosis: Unsteadiness on feet (R26.81);Repeated falls (R29.6);Muscle weakness (generalized) (M62.81)   Activity Tolerance Patient tolerated treatment well   Patient Left in chair;with call bell/phone within reach;with chair alarm set   Nurse Communication Mobility status        Time: 1610-9604 OT Time Calculation (min): 25 min  Charges: OT General Charges $OT Visit: 1 Visit OT Treatments $Self Care/Home Management : 23-37 mins  Marcy Siren, OT Pager [phone] 05/03/2018    Javier Grimes 05/03/2018, 3:52 PM

## 2018-05-04 DIAGNOSIS — F10230 Alcohol dependence with withdrawal, uncomplicated: Secondary | ICD-10-CM | POA: Diagnosis not present

## 2018-05-04 DIAGNOSIS — K831 Obstruction of bile duct: Secondary | ICD-10-CM | POA: Diagnosis not present

## 2018-05-04 DIAGNOSIS — K859 Acute pancreatitis without necrosis or infection, unspecified: Secondary | ICD-10-CM | POA: Diagnosis not present

## 2018-05-04 DIAGNOSIS — K7200 Acute and subacute hepatic failure without coma: Secondary | ICD-10-CM | POA: Diagnosis not present

## 2018-05-04 DIAGNOSIS — K7010 Alcoholic hepatitis without ascites: Secondary | ICD-10-CM | POA: Diagnosis not present

## 2018-05-04 DIAGNOSIS — F1010 Alcohol abuse, uncomplicated: Secondary | ICD-10-CM | POA: Diagnosis not present

## 2018-05-04 DIAGNOSIS — D649 Anemia, unspecified: Secondary | ICD-10-CM | POA: Diagnosis not present

## 2018-05-04 LAB — BASIC METABOLIC PANEL
Anion gap: 6 (ref 5–15)
BUN: 8 mg/dL (ref 6–20)
CALCIUM: 7.7 mg/dL — AB (ref 8.9–10.3)
CO2: 22 mmol/L (ref 22–32)
CREATININE: 0.4 mg/dL — AB (ref 0.61–1.24)
Chloride: 112 mmol/L — ABNORMAL HIGH (ref 98–111)
GFR calc Af Amer: >60 mL/min (ref >60)
Glucose, Bld: 97 mg/dL (ref 70–99)
Potassium: 3.7 mmol/L (ref 3.5–5.1)
SODIUM: 140 mmol/L (ref 135–145)

## 2018-05-04 LAB — BODY FLUID CULTURE: CULTURE: NO GROWTH

## 2018-05-04 LAB — HEPATIC FUNCTION PANEL
ALBUMIN: 1.6 g/dL — AB (ref 3.5–5.0)
ALK PHOS: 270 U/L — AB (ref 38–126)
ALT: 45 U/L — ABNORMAL HIGH (ref 0–44)
AST: 147 U/L — ABNORMAL HIGH (ref 15–41)
BILIRUBIN INDIRECT: 7.8 mg/dL — AB (ref 0.3–0.9)
Bilirubin, Direct: 13.2 mg/dL — ABNORMAL HIGH (ref 0.0–0.2)
TOTAL PROTEIN: 5.1 g/dL — AB (ref 6.5–8.1)
Total Bilirubin: 21 mg/dL (ref 0.3–1.2)

## 2018-05-04 LAB — CBC
HCT: 32.6 % — ABNORMAL LOW (ref 39.0–52.0)
Hemoglobin: 10.8 g/dL — ABNORMAL LOW (ref 13.0–17.0)
MCH: 41.2 pg — AB (ref 26.0–34.0)
MCHC: 33.1 g/dL (ref 30.0–36.0)
MCV: 124.4 fL — ABNORMAL HIGH (ref 78.0–100.0)
PLATELETS: 187 10*3/uL (ref 150–400)
RBC: 2.62 MIL/uL — ABNORMAL LOW (ref 4.22–5.81)
RDW: 18.3 % — AB (ref 11.5–15.5)
WBC: 8.7 10*3/uL (ref 4.0–10.5)

## 2018-05-04 LAB — PROTIME-INR
INR: 1.14
PROTHROMBIN TIME: 14.5 s (ref 11.4–15.2)

## 2018-05-04 MED ORDER — ONDANSETRON 4 MG PO TBDP: 4.0000 mg | ORAL_TABLET | Freq: Four times a day (QID) | ORAL | Status: DC | PRN

## 2018-05-04 MED ORDER — HYDROXYZINE HCL 25 MG PO TABS: 25.0000 mg | ORAL_TABLET | Freq: Four times a day (QID) | ORAL | Status: DC | PRN

## 2018-05-04 MED ORDER — LACTULOSE 10 GM/15ML PO SOLN
30.0000 g | Freq: Two times a day (BID) | ORAL | Status: DC
  Administered 2018-05-04 – 2018-05-06 (×4): 30 g via ORAL
  Filled 2018-05-04 (×4): qty 60

## 2018-05-04 MED ORDER — LACTULOSE 10 GM/15ML PO SOLN
30.0000 g | Freq: Two times a day (BID) | ORAL | Status: DC
  Administered 2018-05-04: 30 g via ORAL
  Filled 2018-05-04: qty 60

## 2018-05-04 MED ORDER — RIFAXIMIN 550 MG PO TABS
550.0000 mg | ORAL_TABLET | Freq: Two times a day (BID) | ORAL | Status: DC
  Administered 2018-05-05 – 2018-05-06 (×4): 550 mg via ORAL
  Filled 2018-05-04 (×4): qty 1

## 2018-05-04 MED ORDER — POTASSIUM CHLORIDE CRYS ER 20 MEQ PO TBCR
40.0000 meq | EXTENDED_RELEASE_TABLET | Freq: Once | ORAL | Status: AC
  Administered 2018-05-04: 40 meq via ORAL
  Filled 2018-05-04: qty 2

## 2018-05-04 MED ORDER — CHLORDIAZEPOXIDE HCL 25 MG PO CAPS: 25.0000 mg | ORAL_CAPSULE | Freq: Four times a day (QID) | ORAL | Status: DC | PRN

## 2018-05-04 NOTE — Progress Notes (Signed)
PROGRESS NOTE    Javier Grimes  ZOX:[id number] DOB: April 02, 1971 DOA: 04/29/2018 PCP: Patient, No Pcp Per   Brief Narrative:  Javier Grimes is a 47 y.o. male with stable alcohol abuse presents to the ER with complaints of having jaundice noticed over the last 2 weeks.  Patient has been having persistent diarrhea over the last 2 weeks feeling weak and abdomen getting mildly distended.  Patient also noticed lower extremity edema.  Since patient had persistent jaundice noticed over the last 2 weeks patient came to the ER.  Patient takes ibuprofen for his chronic low back pain for which patient also had surgery.  Denies taking Tylenol.  Admits to drinking alcohol every day lasting 5 hours prior to admission.  ED Course: In the ER patient on exam has jaundice and with tremors from withdrawal and bilirubin is around 19.  Hemoglobin is around 12.  CT abdomen and pelvis done shows hepatomegaly and some inflammation around the pancreas.  Lipase is mildly elevated.  AST was 438 ALT 62.  Patient admitted for acute hepatitis.    Assessment & Plan:   Principal Problem:   Acute alcoholic hepatitis Active Problems:   Hyperbilirubinemia   Acute hepatic encephalopathy   Jaundice   Transaminitis   Acute hepatitis   Diarrhea   Alcohol withdrawal (HCC)   Alcohol abuse   Ascites   Folate deficiency   Left-sided low back pain without sciatica  1 acute alcoholic hepatitis/jaundice/transaminitis/hyperbilirubinemia Likely secondary to an acute alcoholic hepatitis.  Acute viral hepatitis panel negative.  MRCP/CT abdomen and pelvis negative for any biliary obstruction.  MRCP negative for acute pancreatitis.  LFTs seem to have plateaued and starting to slowly trend back down.  Bilirubin starting to slowly trend back down however still elevated at 22.7.  INR decreasing and currently at 1.14.  Discriminant function score < 32 per GI and no indication for steroids at this time.  Patient denies any excessive use  of Tylenol.  Tylenol level was unremarkable.  GI following and appreciate input and recommendations.  2.  Hepatic encephalopathy Per family patient with increasing confusion.  CT head which was obtained was negative for any acute abnormalities.  Ammonia level obtained on 05/01/2018 was 75.  Ammonia level elevated at 112 on 05/03/2018.  Lactulose dose was increased however patient states had multiple stools overnight greater than 20.  Decrease lactulose to 30 g twice daily.  Follow.  GI following and appreciate input and recommendations.  3.  Alcohol withdrawal/alcohol abuse On admission patient noted to have significant tremors and tachycardia from alcohol withdrawal.  Tremors improving daily. Patient states drinks about 1/5 of vodka on a daily basis for over 20 to 30 years.  Continue Librium detox protocol.  Continue thiamine, folic acid, multivitamin.  IV Ativan as needed.  Alcohol cessation stressed to patient.    4.  Ascites Concern for SBP.  Status post ultrasound-guided therapeutic and diagnostic paracentesis yielding 280 cc of golden yellow fluid.  Patient with no abdominal pain.  Ascitic fluid with a glucose level of 89, albumin of less than 1, protein of less than 3, WBC of 60.  Ascitic fluid cultures preliminary readings with no organisms seen.  Patient currently afebrile.  Chest x-ray was negative as patient was noted to have a low-grade temperature on 05/01/2018.  Blood cultures pending.  IV Levaquin has been discontinued.  No need for antibiotics at this time.  GI following.  5.  Diarrhea Patient with no recent antibiotics.??  Pancreatic insufficiency.  Diarrhea  improved.  C. difficile PCR and stool studies discontinued.  Patient on Imodium as needed.  Patient has been started on lactulose for probable hepatic encephalopathy.  Monitor stool output.   6.  Hypokalemia  Repleted.  Magnesium level at 2.4.   7.  Macrocytic anemia/folate deficiency Likely alcohol induced.  Patient with no overt  bleeding.  Anemia panel with a elevated ferritin level.  Folate level is decreased at 3.2.  Vitamin B12 levels are elevated.  Continue folic acid.  Continue PPI.  Follow H&H.  Transfusion threshold hemoglobin less than 7.  GI following.    8.  Peripancreatic inflammation per CT scan MRCP with no evidence of biliary obstruction or cholecystitis.  MRCP with no evidence of acute pancreatitis.  GI consulted and following.  9 elevated ferritin ??  Hemochromatosis.  Labs for hemochromatosis pending per GI. Follow.  10.  Protein calorie malnutrition Likely secondary to alcohol abuse/alcohol dependence.  Continue current nutritional supplementation.  Dietitian following.    11. low back pain/bilateral lower extremity weakness Patient with complaints of left-sided low back pain and lower extremity weakness.  Patient denies any shooting pain down the left side of his leg.  Patient with some complaints of numbness on the bottom of his feet.  Patient states had surgery done on his back about 6 to 12 months ago.  CT abdomen and pelvis done on admission with L4-5 probable disc extrusion which may affect the transversing left L5 nerve.  Will need to follow-up with his neurosurgeon in the outpatient setting.    DVT prophylaxis: SCDs Code Status: Full Family Communication: Updated patient.  No family at bedside. Disposition Plan: Likely home with home health once Librium withdrawal protocol is completed and when okay with gastroenterology.     Consultants:   Gastroenterology: Dr. Lavon Paganini 04/30/2018  Procedures:   CT abdomen and pelvis 04/29/2018  MRCP abdomen 04/30/2018  Ultrasound-guided paracentesis --280 cc of golden yellow fluid per interventional radiology Darrell Allred, PA 04/30/2018  CT head 05/01/2018  Chest x-ray 05/01/2018  Antimicrobials:   IV Levaquin 04/30/2018>>>>05/02/2018   Subjective: Patient states had multiple stools all night long.  Denies any chest pain or shortness of  breath.  Some improvement with confusion per RN.    Objective: Vitals:   05/03/18 0500 05/03/18 1230 05/03/18 2137 05/04/18 0558  BP:  101/68 111/67 109/75  Pulse:  96 91 93  Resp:   16 18  Temp:  98.9 F (37.2 C) 97.8 F (36.6 C) 97.6 F (36.4 C)  TempSrc:  Oral Oral Oral  SpO2:  98% 99% 96%  Weight: 77.2 kg (170 lb 4.8 oz)   78.3 kg (172 lb 11.2 oz)  Height:        Intake/Output Summary (Last 24 hours) at 05/04/2018 0944 Last data filed at 05/04/2018 0823 Gross per 24 hour  Intake 480 ml  Output -  Net 480 ml   Filed Weights   05/01/18 0500 05/03/18 0500 05/04/18 0558  Weight: 84.3 kg (185 lb 13.6 oz) 77.2 kg (170 lb 4.8 oz) 78.3 kg (172 lb 11.2 oz)    Examination:  General exam: Jaundiced.  Less Tremors.  Scleral icterus. Respiratory system: CTAB.  No wheezes, no crackles, no rhonchi.  Normal respiratory effort.  Cardiovascular system: RRR no murmurs rubs or gallops.  No JVD.  No lower extremity edema.   Gastrointestinal system: Abdomen is soft, nontender, nondistended, positive bowel sounds.  Hepatomegaly.  No rebound.  No guarding. Central nervous system: Alert and oriented to  self and place. No focal neurological deficits. Extremities: Symmetric 5 x 5 power. Skin: No rashes, lesions or ulcers Psychiatry: Judgement and insight appear-fair. Mood & affect appropriate.     Data Reviewed: I have personally reviewed following labs and imaging studies  CBC: Recent Labs  Lab 04/30/18 0319 05/01/18 0537 05/02/18 0406 05/03/18 0424 05/04/18 0417  WBC 7.2 7.6 9.0 8.0 8.7  NEUTROABS 5.1  --  6.8 5.4  --   HGB 10.5* 10.8* 11.8* 11.2* 10.8*  HCT 30.4* 31.7* 34.8* 33.7* 32.6*  MCV 116.0* 119.2* 120.0* 123.9* 124.4*  PLT 200 163 188 181 187   Basic Metabolic Panel: Recent Labs  Lab 04/30/18 0319 04/30/18 0656 05/01/18 0537 05/02/18 0406 05/03/18 0424 05/04/18 0417  NA 139  --  135 137 137 140  K 3.0*  --  3.5 3.7 3.4* 3.7  CL 107  --  106 109 109 112*  CO2  24  --  23 22 20* 22  GLUCOSE 89  --  87 95 89 97  BUN 7  --  6 8 8 8   CREATININE <0.30*  --  <0.30* <0.30* <0.30* 0.40*  CALCIUM 7.1*  --  7.4* 7.8* 7.7* 7.7*  MG  --  1.7 2.4 2.3 2.4  --    GFR: Estimated Creatinine Clearance: 125.3 mL/min (A) (by C-G formula based on SCr of 0.4 mg/dL (L)). Liver Function Tests: Recent Labs  Lab 04/30/18 0319 05/01/18 0537 05/02/18 0406 05/03/18 0424 05/04/18 0417  AST 389* 326* 255* 192* 147*  ALT 60* 57* 53* 49* 45*  ALKPHOS 348* 330* 333* 291* 270*  BILITOT 17.0* 20.3* 23.6* 22.7* 21.0*  PROT 5.1* 5.1* 5.6* 5.2* 5.1*  ALBUMIN 1.7* 1.6* 1.7* 1.7* 1.6*   Recent Labs  Lab 04/29/18 1910  LIPASE 76*   Recent Labs  Lab 05/01/18 1101 05/02/18 0406 05/03/18 0424  AMMONIA 75* 67* 112*   Coagulation Profile: Recent Labs  Lab 04/30/18 0319 05/01/18 0537 05/02/18 0406 05/03/18 0833 05/04/18 0417  INR 1.21 1.26 1.19 1.17 1.14   Cardiac Enzymes: No results for input(s): CKTOTAL, CKMB, CKMBINDEX, TROPONINI in the last 168 hours. BNP (last 3 results) No results for input(s): PROBNP in the last 8760 hours. HbA1C: No results for input(s): HGBA1C in the last 72 hours. CBG: No results for input(s): GLUCAP in the last 168 hours. Lipid Profile: No results for input(s): CHOL, HDL, LDLCALC, TRIG, CHOLHDL, LDLDIRECT in the last 72 hours. Thyroid Function Tests: No results for input(s): TSH, T4TOTAL, FREET4, T3FREE, THYROIDAB in the last 72 hours. Anemia Panel: No results for input(s): VITAMINB12, FOLATE, FERRITIN, TIBC, IRON, RETICCTPCT in the last 72 hours. Sepsis Labs: No results for input(s): PROCALCITON, LATICACIDVEN in the last 168 hours.  Recent Results (from the past 240 hour(s))  MRSA PCR Screening     Status: None   Collection Time: 04/30/18 12:37 AM  Result Value Ref Range Status   MRSA by PCR NEGATIVE NEGATIVE Final    Comment:        The GeneXpert MRSA Assay (FDA approved for NASAL specimens only), is one component of  a comprehensive MRSA colonization surveillance program. It is not intended to diagnose MRSA infection nor to guide or monitor treatment for MRSA infections. Performed at [facility], [address], [place], [place] [address]   Body fluid culture     Status: None (Preliminary result)   Collection Time: 04/30/18  3:38 PM  Result Value Ref Range Status   Specimen Description  Final    ASCITIC Performed at [facility], [address] [place], [place] [address]    Special Requests   Final    NONE Performed at [facility], [address] [place], [place] [address]    Gram Stain   Final    MODERATE WBC PRESENT, PREDOMINANTLY MONONUCLEAR NO ORGANISMS SEEN    Culture   Final    NO GROWTH 3 DAYS Performed at [facility], [address] [place], [place] [address]    Report Status PENDING  Incomplete  Culture, blood (Routine X 2) w Reflex to ID Panel     Status: None (Preliminary result)   Collection Time: 05/01/18  8:01 AM  Result Value Ref Range Status   Specimen Description   Final    BLOOD RIGHT ANTECUBITAL Performed at [facility], [address] [place], [place] [address]    Special Requests   Final    BOTTLES DRAWN AEROBIC ONLY Blood Culture results may not be optimal due to an excessive volume of blood received in culture bottles Performed at [facility], [address] [place], [place] [address]    Culture   Final    NO GROWTH 2 DAYS Performed at [facility], [address] [place], [place] [address]    Report Status PENDING  Incomplete  Culture, blood (Routine X 2) w Reflex to ID Panel     Status: None (Preliminary result)   Collection Time: 05/01/18  8:02 AM  Result Value Ref Range Status   Specimen Description   Final    BLOOD RIGHT HAND Performed at [facility], [address] [place], [place] [address]    Special Requests   Final     BOTTLES DRAWN AEROBIC ONLY Blood Culture adequate volume Performed at [facility], [address] [place], [place] [address]    Culture   Final    NO GROWTH 3 DAYS Performed at [facility], [address] [place], [place] [address]    Report Status PENDING  Incomplete         Radiology Studies: No results found.      Scheduled Meds: . chlordiazePOXIDE  25 mg Oral Daily  . feeding supplement (ENSURE ENLIVE)  237 mL Oral TID BM  . folic acid  1 mg Oral Daily  . ibuprofen  600 mg Oral TID  . lactulose  30 g Oral BID  . multivitamin with minerals  1 tablet Oral Daily  . nicotine  21 mg Transdermal Daily  . pantoprazole  40 mg Oral Q0600  . potassium chloride  40 mEq Oral Once  . thiamine  100 mg Oral Once   Continuous Infusions:    LOS: 5 days    Time spent: 35 minutes    Ramiro Harvestaniel Thompson, MD [facility] Pager [phone]  If 7PM-7AM, please contact night-coverage [email or web address] Password [email or web address] 05/04/2018, 9:44 AM

## 2018-05-04 NOTE — Progress Notes (Signed)
Progress Note   Subjective  Chief Complaint: Alcoholic hepatitis  Found asleep in bed this morning, explains that he is doing well this morning just "really tired".  Denies any new complaints.   Objective   Vital signs in last 24 hours: Temp:  [97.6 F (36.4 C)-98.9 F (37.2 C)] 97.6 F (36.4 C) (07/16 0558) Pulse Rate:  [91-96] 93 (07/16 0558) Resp:  [16-18] 18 (07/16 0558) BP: (101-111)/(67-75) 109/75 (07/16 0558) SpO2:  [96 %-99 %] 96 % (07/16 0558) Weight:  [172 lb 11.2 oz (78.3 kg)] 172 lb 11.2 oz (78.3 kg) (07/16 0558) Last BM Date: 05/03/18 General:    white male in NAD Heart:  Regular rate and rhythm; no murmurs Lungs: Respirations even and unlabored, lungs CTA bilaterally Abdomen:  Soft, nontender and moderate distention. Normal bowel sounds. Extremities:  Without edema. Neurologic:  Alert and oriented,  grossly normal neurologically. Psych:  Cooperative. Normal mood and affect.  Intake/Output from previous day: 07/15 0701 - 07/16 0700 In: 480 [P.O.:480] Out: -  Intake/Output this shift: Total I/O In: 120 [P.O.:120] Out: -   Lab Results: Recent Labs    05/02/18 0406 05/03/18 0424 05/04/18 0417  WBC 9.0 8.0 8.7  HGB 11.8* 11.2* 10.8*  HCT 34.8* 33.7* 32.6*  PLT 188 181 187   BMET Recent Labs    05/02/18 0406 05/03/18 0424 05/04/18 0417  NA 137 137 140  K 3.7 3.4* 3.7  CL 109 109 112*  CO2 22 20* 22  GLUCOSE 95 89 97  BUN _0 CREATININE <0.30* <0.30* 0.40*  CALCIUM 7.8* 7.7* 7.7*   Hepatic Function Latest Ref Rng & Units 05/04/2018 05/03/2018 05/02/2018  Total Protein 6.5 - 8.1 g/dL 5.1(L) 5.2(L) 5.6(L)  Albumin 3.5 - 5.0 g/dL 1.6(L) 1.7(L) 1.7(L)  AST 15 - 41 U/L 147(H) 192(H) 255(H)  ALT 0 - 44 U/L 45(H) 49(H) 53(H)  Alk Phosphatase 38 - 126 U/L 270(H) 291(H) 333(H)  Total Bilirubin 0.3 - 1.2 mg/dL 21.0(HH) 22.7(HH) 23.6(HH)  Bilirubin, Direct 0.0 - 0.2 mg/dL 13.2(H) 15.4(H) 14.1(H)    PT/INR Recent Labs    05/03/18 0833  05/04/18 0417  LABPROT 14.8 14.5  INR 1.17 1.14     Assessment / Plan:   Assessment: 1.  Alcoholic hepatitis with marked cholestasis: Bilirubin continues to trend down, 23.6-->22.7-->21.0 today, overall patient is improved 2.  Hyperferritinemia: HFE gene still pending, though suspect iron overload is alcohol related 3.  Mild pancreatitis?  CT scan 7/11  Plan: 1.  Continue current medications 2.  Continue to trend liver enzymes and INR 3.  Please await any further recommendations from Dr. Hilarie Fredrickson later today  Thank you for kind consultation, we will continue to follow.    LOS: 5 days   Levin Erp  05/04/2018, 9:55 AM  Pager # [phone]

## 2018-05-04 NOTE — Progress Notes (Signed)
Occupational Therapy Treatment Patient Details Name: Javier Grimes MRN: [id number] DOB: October 18, 1971 Today's Date: 05/04/2018    History of present illness 47 yo male admitted with Acute hepatitis with jaundice. pt currently on CIWA due to ETOH withdrawal. hx of ETOH abuse   OT comments  Pt presents supine in bed, reports increased fatigue today but is agreeable to OT treatment session. Pt completing room and hallway level functional mobility using RW with minA. Additional focus on seated UB strengthening/exercise using level 2 theraband with pt return demonstrating with min cues. Feel POC remains appropriate at this time. Will continue to follow acutely to progress pt towards established OT goals.   Follow Up Recommendations  Home health OT;Supervision/Assistance - 24 hour    Equipment Recommendations  None recommended by OT          Precautions / Restrictions Precautions Precautions: Fall Precaution Comments: tremors Restrictions Weight Bearing Restrictions: No       Mobility Bed Mobility Overal bed mobility: Needs Assistance Bed Mobility: Supine to Sit;Sit to Supine     Supine to sit: Supervision Sit to supine: Supervision   General bed mobility comments: for safety   Transfers Overall transfer level: Needs assistance Equipment used: Rolling walker (2 wheeled) Transfers: Sit to/from Stand Sit to Stand: Min assist         General transfer comment: Assist to steady. VCs hand placement.     Balance Overall balance assessment: Needs assistance Sitting-balance support: Bilateral upper extremity supported;Feet supported Sitting balance-Leahy Scale: Good     Standing balance support: Bilateral upper extremity supported Standing balance-Leahy Scale: Poor                             ADL either performed or assessed with clinical judgement   ADL Overall ADL's : Needs assistance/impaired                                      Functional mobility during ADLs: Minimal assistance;Rolling walker General ADL Comments: pt with increased fatigue this session but willing to work with therapy. requesting to ambulate in hallway with pt completing with minA using RW; pt completing additional seated UB exercises with theraband provided                        Cognition Arousal/Alertness: Awake/alert Behavior During Therapy: Flat affect Overall Cognitive Status: Impaired/Different from baseline                                 General Comments: cognition appears closer to [facility] this session; will continue to assess         Exercises General Exercises - Upper Extremity Shoulder Flexion: AROM;10 reps;Theraband;Both;Seated Theraband Level (Shoulder Flexion): Level 2 (Red) Shoulder Extension: AROM;Both;10 reps;Theraband;Seated Theraband Level (Shoulder Extension): Level 2 (Red) Shoulder Horizontal ABduction: AROM;10 reps;Both;Seated;Theraband Theraband Level (Shoulder Horizontal Abduction): Level 2 (Red) Shoulder Horizontal ADduction: AROM;10 reps;Both;Seated;Theraband Theraband Level (Shoulder Horizontal Adduction): Level 2 (Red)   Shoulder Instructions       General Comments      Pertinent Vitals/ Pain       Pain Assessment: No/denies pain  Home Living  Prior Functioning/Environment              Frequency  Min 3X/week        Progress Toward Goals  OT Goals(current goals can now be found in the care plan section)  Progress towards OT goals: Progressing toward goals  Acute Rehab OT Goals Patient Stated Goal: regain independence OT Goal Formulation: With patient Time For Goal Achievement: 05/16/18 Potential to Achieve Goals: Good  Plan Discharge plan remains appropriate    Co-evaluation                 AM-PAC PT "6 Clicks" Daily Activity     Outcome Measure   Help from another person eating meals?:  None Help from another person taking care of personal grooming?: A Little Help from another person toileting, which includes using toliet, bedpan, or urinal?: A Lot Help from another person bathing (including washing, rinsing, drying)?: A Lot Help from another person to put on and taking off regular upper body clothing?: A Lot Help from another person to put on and taking off regular lower body clothing?: A Lot 6 Click Score: 15    End of Session Equipment Utilized During Treatment: Rolling walker;Gait belt  OT Visit Diagnosis: Unsteadiness on feet (R26.81);Repeated falls (R29.6);Muscle weakness (generalized) (M62.81)   Activity Tolerance Patient tolerated treatment well   Patient Left in bed;with call bell/phone within reach;with bed alarm set   Nurse Communication Mobility status        Time: [id number] OT Time Calculation (min): 23 min  Charges: OT General Charges $OT Visit: 1 Visit OT Treatments $Therapeutic Activity: 8-22 mins  Marcy SirenBreanna Fallyn Munnerlyn, OT Pager [phone] 05/04/2018   Orlando PennerBreanna L Cheresa Siers 05/04/2018, 3:17 PM

## 2018-05-05 DIAGNOSIS — K7010 Alcoholic hepatitis without ascites: Secondary | ICD-10-CM | POA: Diagnosis not present

## 2018-05-05 DIAGNOSIS — K7200 Acute and subacute hepatic failure without coma: Secondary | ICD-10-CM | POA: Diagnosis not present

## 2018-05-05 DIAGNOSIS — F10230 Alcohol dependence with withdrawal, uncomplicated: Secondary | ICD-10-CM | POA: Diagnosis not present

## 2018-05-05 DIAGNOSIS — F1010 Alcohol abuse, uncomplicated: Secondary | ICD-10-CM | POA: Diagnosis not present

## 2018-05-05 LAB — HEPATIC FUNCTION PANEL
ALBUMIN: 1.6 g/dL — AB (ref 3.5–5.0)
ALK PHOS: 261 U/L — AB (ref 38–126)
ALT: 48 U/L — AB (ref 0–44)
AST: 139 U/L — ABNORMAL HIGH (ref 15–41)
BILIRUBIN INDIRECT: 6.7 mg/dL — AB (ref 0.3–0.9)
BILIRUBIN TOTAL: 19 mg/dL — AB (ref 0.3–1.2)
Bilirubin, Direct: 12.3 mg/dL — ABNORMAL HIGH (ref 0.0–0.2)
Total Protein: 5.3 g/dL — ABNORMAL LOW (ref 6.5–8.1)

## 2018-05-05 LAB — CBC
HCT: 33.7 % — ABNORMAL LOW (ref 39.0–52.0)
HEMOGLOBIN: 11.1 g/dL — AB (ref 13.0–17.0)
MCH: 41.4 pg — ABNORMAL HIGH (ref 26.0–34.0)
MCHC: 32.9 g/dL (ref 30.0–36.0)
MCV: 125.7 fL — AB (ref 78.0–100.0)
Platelets: 197 10*3/uL (ref 150–400)
RBC: 2.68 MIL/uL — AB (ref 4.22–5.81)
RDW: 18 % — ABNORMAL HIGH (ref 11.5–15.5)
WBC: 9.9 10*3/uL (ref 4.0–10.5)

## 2018-05-05 LAB — BASIC METABOLIC PANEL
Anion gap: 8 (ref 5–15)
BUN: 9 mg/dL (ref 6–20)
CHLORIDE: 110 mmol/L (ref 98–111)
CO2: 22 mmol/L (ref 22–32)
Calcium: 8 mg/dL — ABNORMAL LOW (ref 8.9–10.3)
GLUCOSE: 95 mg/dL (ref 70–99)
POTASSIUM: 4 mmol/L (ref 3.5–5.1)
Sodium: 140 mmol/L (ref 135–145)

## 2018-05-05 LAB — PROTIME-INR
INR: 1.13
PROTHROMBIN TIME: 14.4 s (ref 11.4–15.2)

## 2018-05-05 LAB — AMMONIA: AMMONIA: 63 umol/L — AB (ref 9–35)

## 2018-05-05 NOTE — Progress Notes (Signed)
Chart and labs reviewed, but patient not seen.  Labs improving.  Nothing new to add from a GI standpoint.  Expect that he will continue to improve slowly.  Most important factor for his prognosis is his decision to continue without ETOH.  Inpatient rehab is recommended.  We are available for questions, but GI signing off for now.

## 2018-05-05 NOTE — Progress Notes (Signed)
PROGRESS NOTE    Javier Grimes  ZOX:[id number] DOB: 1970-12-19 DOA: 04/29/2018 PCP: Patient, No Pcp Per   Brief Narrative:  Javier Grimes is a 47 y.o. male with stable alcohol abuse presents to the ER with complaints of having jaundice noticed over the last 2 weeks.  Patient has been having persistent diarrhea over the last 2 weeks feeling weak and abdomen getting mildly distended.  Patient also noticed lower extremity edema.  Since patient had persistent jaundice noticed over the last 2 weeks patient came to the ER.  Patient takes ibuprofen for his chronic low back pain for which patient also had surgery.  Denies taking Tylenol.  Admits to drinking alcohol every day lasting 5 hours prior to admission.  ED Course: In the ER patient on exam has jaundice and with tremors from withdrawal and bilirubin is around 19.  Hemoglobin is around 12.  CT abdomen and pelvis done shows hepatomegaly and some inflammation around the pancreas.  Lipase is mildly elevated.  AST was 438 ALT 62.  Patient admitted for acute hepatitis.    Assessment & Plan:   Principal Problem:   Acute alcoholic hepatitis Active Problems:   Hyperbilirubinemia   Acute hepatic encephalopathy   Jaundice   Transaminitis   Acute hepatitis   Diarrhea   Alcohol withdrawal (HCC)   Alcohol abuse   Ascites   Folate deficiency   Left-sided low back pain without sciatica  1 acute alcoholic hepatitis/jaundice/transaminitis/hyperbilirubinemia Likely secondary to an acute alcoholic hepatitis.  Acute viral hepatitis panel negative.  MRCP/CT abdomen and pelvis negative for any biliary obstruction.  MRCP negative for acute pancreatitis.  LFTs seem to have plateaued and starting to slowly trend back down.  Bilirubin starting to slowly trend back down however still elevated at 19.0.  INR decreasing and currently at 1.13.  Discriminant function score < 32 per GI and no indication for steroids at this time.  Patient denies any excessive use  of Tylenol.  Tylenol level was unremarkable.  GI following and appreciate input and recommendations.  2.  Hepatic encephalopathy Per family patient with increasing confusion.  CT head which was obtained was negative for any acute abnormalities.  Ammonia level obtained on 05/01/2018 was 75.  Ammonia level elevated at 112 on 05/03/2018.  Ammonia levels trending down and currently at 63.  Lactulose dose was increased however patient stated had multiple stools overnight greater than 20.  Decreased lactulose to 30 g twice daily.  GI following and appreciate input and recommendations.  3.  Alcohol withdrawal/alcohol abuse On admission patient noted to have significant tremors and tachycardia from alcohol withdrawal.  Tremors improving daily. Patient states drinks about 1/5 of vodka on a daily basis for over 20 to 30 years.  Continue Librium detox protocol.  Continue thiamine, folic acid, multivitamin.  IV Ativan as needed.  Alcohol cessation stressed to patient.    4.  Ascites Concern for SBP.  Status post ultrasound-guided therapeutic and diagnostic paracentesis yielding 280 cc of golden yellow fluid.  Patient with no abdominal pain.  Ascitic fluid with a glucose level of 89, albumin of less than 1, protein of less than 3, WBC of 60.  Ascitic fluid cultures preliminary readings with no organisms seen.  Patient currently afebrile.  Chest x-ray was negative as patient was noted to have a low-grade temperature on 05/01/2018.  Blood cultures pending.  IV Levaquin has been discontinued.  No need for antibiotics at this time.  GI following.  5.  Diarrhea Patient with no  recent antibiotics.??  Pancreatic insufficiency.  Diarrhea improved.  C. difficile PCR and stool studies discontinued.  Patient on Imodium as needed.  Patient has been started on lactulose for hepatic encephalopathy.  Monitor stool output.   6.  Hypokalemia  Repleted.  Magnesium at 2.4.    7.  Macrocytic anemia/folate deficiency Likely alcohol  induced.  Patient with no overt bleeding.  Anemia panel with a elevated ferritin level.  Folate level is decreased at 3.2.  Vitamin B12 levels are elevated.  Continue folic acid.  Continue PPI.  Follow H&H.  Transfusion threshold hemoglobin less than 7.  GI following.    8.  Peripancreatic inflammation per CT scan MRCP with no evidence of biliary obstruction or cholecystitis.  MRCP with no evidence of acute pancreatitis.  GI consulted and following.  9 elevated ferritin ??  Hemochromatosis.  Labs for hemochromatosis pending per GI.  Will need outpatient follow-up with GI.  Follow.  10.  Protein calorie malnutrition Likely secondary to alcohol abuse/alcohol dependence.  Continue current nutritional supplementation.  Dietitian following.    11. low back pain/bilateral lower extremity weakness Patient with complaints of left-sided low back pain and lower extremity weakness.  Patient denies any shooting pain down the left side of his leg.  Patient with some complaints of numbness on the bottom of his feet.  Patient states had surgery done on his back about 6 to 12 months ago.  CT abdomen and pelvis done on admission with L4-5 probable disc extrusion which may affect the transversing left L5 nerve.  Will need to follow-up with his neurosurgeon in the outpatient setting.    DVT prophylaxis: SCDs Code Status: Full Family Communication: Updated patient.  No family at bedside. Disposition Plan: Likely home with home health once Librium withdrawal protocol is completed and when okay with gastroenterology versus inpatient alcohol rehabilitation program.     Consultants:   Gastroenterology: Dr. Lavon PaganiniNandigam 04/30/2018  Procedures:   CT abdomen and pelvis 04/29/2018  MRCP abdomen 04/30/2018  Ultrasound-guided paracentesis --280 cc of golden yellow fluid per interventional radiology Darrell Allred, PA 04/30/2018  CT head 05/01/2018  Chest x-ray 05/01/2018  Antimicrobials:   IV Levaquin  04/30/2018>>>>05/02/2018   Subjective: Patient states had 2 bowel movements last night.  Denies any chest pain or shortness of breath.  Feels his confusion is improving.    Objective: Vitals:   05/04/18 0558 05/04/18 1346 05/04/18 2232 05/05/18 0523  BP: 109/75 113/71 115/76 105/67  Pulse: 93 94 90 92  Resp: 18  18 18   Temp: 97.6 F (36.4 C) 97.9 F (36.6 C) 97.7 F (36.5 C) 99.2 F (37.3 C)  TempSrc: Oral Oral    SpO2: 96% 98% 99% 97%  Weight: 78.3 kg (172 lb 11.2 oz)   78.8 kg (173 lb 12.8 oz)  Height:        Intake/Output Summary (Last 24 hours) at 05/05/2018 1204 Last data filed at 05/05/2018 0900 Gross per 24 hour  Intake 240 ml  Output -  Net 240 ml   Filed Weights   05/03/18 0500 05/04/18 0558 05/05/18 0523  Weight: 77.2 kg (170 lb 4.8 oz) 78.3 kg (172 lb 11.2 oz) 78.8 kg (173 lb 12.8 oz)    Examination:  General exam: Jaundiced.  Less Tremors.  Scleral icterus. Respiratory system: Lungs clear to auscultation bilaterally anterior lung fields.  No wheezes, no crackles, no rhonchi.  Normal respiratory effort. Cardiovascular system: Regular rate and rhythm no murmurs rubs or gallops.  No JVD.  No  lower extremity edema. Gastrointestinal system: Abdomen is nontender, nondistended, soft, positive bowel sounds.  Hepatomegaly.  No rebound.  No guarding.   Central nervous system: Alert and oriented to self and place. No focal neurological deficits. Extremities: Symmetric 5 x 5 power. Skin: No rashes, lesions or ulcers Psychiatry: Judgement and insight appear-fair. Mood & affect appropriate.     Data Reviewed: I have personally reviewed following labs and imaging studies  CBC: Recent Labs  Lab 04/30/18 0319 05/01/18 0537 05/02/18 0406 05/03/18 0424 05/04/18 0417 05/05/18 0413  WBC 7.2 7.6 9.0 8.0 8.7 9.9  NEUTROABS 5.1  --  6.8 5.4  --   --   HGB 10.5* 10.8* 11.8* 11.2* 10.8* 11.1*  HCT 30.4* 31.7* 34.8* 33.7* 32.6* 33.7*  MCV 116.0* 119.2* 120.0* 123.9* 124.4*  125.7*  PLT 200 163 188 181 187 197   Basic Metabolic Panel: Recent Labs  Lab 04/30/18 0656 05/01/18 0537 05/02/18 0406 05/03/18 0424 05/04/18 0417 05/05/18 0413  NA  --  135 137 137 140 140  K  --  3.5 3.7 3.4* 3.7 4.0  CL  --  106 109 109 112* 110  CO2  --  23 22 20* 22 22  GLUCOSE  --  87 95 89 97 95  BUN  --  6 8 8 8 9   CREATININE  --  <0.30* <0.30* <0.30* 0.40* <0.30*  CALCIUM  --  7.4* 7.8* 7.7* 7.7* 8.0*  MG 1.7 2.4 2.3 2.4  --   --    GFR: CrCl cannot be calculated (This lab value cannot be used to calculate CrCl because it is not a number: <0.30). Liver Function Tests: Recent Labs  Lab 05/01/18 0537 05/02/18 0406 05/03/18 0424 05/04/18 0417 05/05/18 0413  AST 326* 255* 192* 147* 139*  ALT 57* 53* 49* 45* 48*  ALKPHOS 330* 333* 291* 270* 261*  BILITOT 20.3* 23.6* 22.7* 21.0* 19.0*  PROT 5.1* 5.6* 5.2* 5.1* 5.3*  ALBUMIN 1.6* 1.7* 1.7* 1.6* 1.6*   Recent Labs  Lab 04/29/18 1910  LIPASE 76*   Recent Labs  Lab 05/01/18 1101 05/02/18 0406 05/03/18 0424 05/05/18 0413  AMMONIA 75* 67* 112* 63*   Coagulation Profile: Recent Labs  Lab 05/01/18 0537 05/02/18 0406 05/03/18 0833 05/04/18 0417 05/05/18 0413  INR 1.26 1.19 1.17 1.14 1.13   Cardiac Enzymes: No results for input(s): CKTOTAL, CKMB, CKMBINDEX, TROPONINI in the last 168 hours. BNP (last 3 results) No results for input(s): PROBNP in the last 8760 hours. HbA1C: No results for input(s): HGBA1C in the last 72 hours. CBG: No results for input(s): GLUCAP in the last 168 hours. Lipid Profile: No results for input(s): CHOL, HDL, LDLCALC, TRIG, CHOLHDL, LDLDIRECT in the last 72 hours. Thyroid Function Tests: No results for input(s): TSH, T4TOTAL, FREET4, T3FREE, THYROIDAB in the last 72 hours. Anemia Panel: No results for input(s): VITAMINB12, FOLATE, FERRITIN, TIBC, IRON, RETICCTPCT in the last 72 hours. Sepsis Labs: No results for input(s): PROCALCITON, LATICACIDVEN in the last 168  hours.  Recent Results (from the past 240 hour(s))  MRSA PCR Screening     Status: None   Collection Time: 04/30/18 12:37 AM  Result Value Ref Range Status   MRSA by PCR NEGATIVE NEGATIVE Final    Comment:        The GeneXpert MRSA Assay (FDA approved for NASAL specimens only), is one component of a comprehensive MRSA colonization surveillance program. It is not intended to diagnose MRSA infection nor to guide or monitor treatment for  MRSA infections. Performed at [facility], [address] [place], [place] [address]   Body fluid culture     Status: None   Collection Time: 04/30/18  3:38 PM  Result Value Ref Range Status   Specimen Description   Final    ASCITIC Performed at [facility], [address], [place], [place] [address]    Special Requests   Final    NONE Performed at [facility], [address], [place], [place] [address]    Gram Stain   Final    MODERATE WBC PRESENT, PREDOMINANTLY MONONUCLEAR NO ORGANISMS SEEN    Culture   Final    NO GROWTH 3 DAYS Performed at [facility], [address] [place], [place] [address]    Report Status 05/04/2018 FINAL  Final  Culture, blood (Routine X 2) w Reflex to ID Panel     Status: None (Preliminary result)   Collection Time: 05/01/18  8:01 AM  Result Value Ref Range Status   Specimen Description   Final    BLOOD RIGHT ANTECUBITAL Performed at [facility], [address] [place], [place] [address]    Special Requests   Final    BOTTLES DRAWN AEROBIC ONLY Blood Culture results may not be optimal due to an excessive volume of blood received in culture bottles Performed at [facility], [address] [place], [place] [address]    Culture   Final    NO GROWTH 3 DAYS Performed at [facility], [address], [place], [place] [address]    Report Status PENDING  Incomplete  Culture, blood (Routine X 2) w Reflex to ID  Panel     Status: None (Preliminary result)   Collection Time: 05/01/18  8:02 AM  Result Value Ref Range Status   Specimen Description   Final    BLOOD RIGHT HAND Performed at [facility], [address] [place], [place] [address]    Special Requests   Final    BOTTLES DRAWN AEROBIC ONLY Blood Culture adequate volume Performed at [facility], [address], [place], [place] [address]    Culture   Final    NO GROWTH 3 DAYS Performed at [facility], [address] [place], [place] [address]    Report Status PENDING  Incomplete         Radiology Studies: No results found.      Scheduled Meds: . feeding supplement (ENSURE ENLIVE)  237 mL Oral TID BM  . folic acid  1 mg Oral Daily  . ibuprofen  600 mg Oral TID  . lactulose  30 g Oral BID  . multivitamin with minerals  1 tablet Oral Daily  . nicotine  21 mg Transdermal Daily  . pantoprazole  40 mg Oral Q0600  . rifaximin  550 mg Oral BID  . thiamine  100 mg Oral Once   Continuous Infusions:    LOS: 6 days    Time spent: 35 minutes    Ramiro Harvest, MD [facility] Pager [phone]  If 7PM-7AM, please contact night-coverage [email or web address] Password [email or web address] 05/05/2018, 12:04 PM

## 2018-05-05 NOTE — Progress Notes (Signed)
Spoke with pt, who was okay with this CM speaking with his mother and sister at bedside. Explained HH. Family continue to ask for [facility] to [facility] to [facility]. Family state that they will pay. CSW informed.

## 2018-05-05 NOTE — Plan of Care (Signed)
Pt was calm and relaxed throughout shift w/ no C/O pain.

## 2018-05-05 NOTE — Progress Notes (Signed)
Physical Therapy Treatment Patient Details Name: Javier Grimes MRN: [id number] DOB: January 19, 1971 Today's Date: 05/05/2018    History of Present Illness 47 yo male admitted with Acute hepatitis with jaundice. pt currently on CIWA due to ETOH withdrawal. hx of ETOH abuse    PT Comments    Progressing slowly with mobility. Improved activity tolerance.    Follow Up Recommendations  Home health PT;Supervision/Assistance - 24 hour     Equipment Recommendations  None recommended by PT    Recommendations for Other Services       Precautions / Restrictions Precautions Precautions: Fall Precaution Comments: tremors Restrictions Weight Bearing Restrictions: No    Mobility  Bed Mobility Overal bed mobility: Needs Assistance Bed Mobility: Supine to Sit;Sit to Supine     Supine to sit: Supervision Sit to supine: Supervision   General bed mobility comments: for safety   Transfers Overall transfer level: Needs assistance Equipment used: Rolling walker (2 wheeled) Transfers: Sit to/from Stand Sit to Stand: Min guard         General transfer comment: close guard for safety. VCs hand placement  Ambulation/Gait Ambulation/Gait assistance: Min assist Gait Distance (Feet): 500 Feet Assistive device: Rolling walker (2 wheeled) Gait Pattern/deviations: Step-through pattern     General Gait Details: Intermittent assist to steady. Still has some shakiness/tremors. Pt tolerated distance well.    Stairs             Wheelchair Mobility    Modified Rankin (Stroke Patients Only)       Balance Overall balance assessment: Needs assistance         Standing balance support: Bilateral upper extremity supported Standing balance-Leahy Scale: Poor Standing balance comment: needs RW for ambulation safety                            Cognition Arousal/Alertness: Awake/alert Behavior During Therapy: Flat affect Overall Cognitive Status: Impaired/Different from  baseline Area of Impairment: Awareness;Safety/judgement                         Safety/Judgement: Decreased awareness of deficits;Decreased awareness of safety     General Comments[facility]; will continue to assess       Exercises      General Comments        Pertinent Vitals/Pain Pain Assessment: No/denies pain    Home Living                      Prior Function            PT Goals (current goals can now be found in the care plan section) Progress towards PT goals: Progressing toward goals    Frequency    Min 3X/week      PT Plan Current plan remains appropriate    Co-evaluation              AM-PAC PT "6 Clicks" Daily Activity  Outcome Measure  Difficulty turning over in bed (including adjusting bedclothes, sheets and blankets)?: None Difficulty moving from lying on back to sitting on the side of the bed? : A Little Difficulty sitting down on and standing up from a chair with arms (e.g., wheelchair, bedside commode, etc,.)?: A Little Help needed moving to and from a bed to chair (including a wheelchair)?: A Little Help needed walking in hospital room?: A Little Help needed climbing 3-5 steps with a  railing? : A Lot 6 Click Score: 18    End of Session Equipment Utilized During Treatment: Gait belt Activity Tolerance: Patient tolerated treatment well Patient left: in bed;with call bell/phone within reach;with family/visitor present   PT Visit Diagnosis: Muscle weakness (generalized) (M62.81);Difficulty in walking, not elsewhere classified (R26.2);Unsteadiness on feet (R26.81)     Time: 8119-1478 PT Time Calculation (min) (ACUTE ONLY): 8 min  Charges:  $Gait Training: 8-22 mins                    G Codes:          Rebeca Alert, MPT Pager: [phone]

## 2018-05-05 NOTE — Progress Notes (Addendum)
Pt resting throughout the shift. Calm able to ambulate to the BR with walker, calling before getting up. Pt tolerating meal and PO without issues. VSs. SRP, RN

## 2018-05-05 NOTE — Plan of Care (Signed)
  Problem: Safety: Goal: Ability to remain free from injury will improve Outcome: Progressing   Problem: Coping: Goal: Level of anxiety will decrease Outcome: Progressing   Problem: Pain Managment: Goal: General experience of comfort will improve Outcome: Progressing   

## 2018-05-05 NOTE — Progress Notes (Addendum)
Spoke with pt's wife Reida [phone] [facility] Wife Reida is very adamant that pt go to an in pt rehab and [facility]. Reida states she will call pt's PCP.

## 2018-05-05 NOTE — Progress Notes (Signed)
CRITICAL VALUE ALERT  Critical Value:  Total Bili 19.0  Date & Time Notied:  0555  Provider Notified: Craige CottaKirby  Orders Received/Actions taken: NA

## 2018-05-05 NOTE — Clinical Social Work Note (Signed)
Clinical Social Work Assessment  Patient Details  Name: Javier Grimes MRN: [id number] Date of Birth: 1971/06/08  Date of referral:  05/05/18               Reason for consult:  Substance Use/ETOH Abuse                Permission sought to share information with:  Family Supports, Other Permission granted to share information::     Name::     Javier Grimes  Agency::  [facility]  Relationship::  Wife  Contact Information:  [phone];  [phone]  Housing/Transportation Living arrangements for the past 2 months:  Single Family Home Source of Information:  Patient, Spouse Patient Interpreter Needed:  None Criminal Activity/Legal Involvement Pertinent to Current Situation/Hospitalization:  No - Comment as needed Significant Relationships:  Parents, Spouse, Siblings Lives with:  Spouse Do you feel safe going back to the place where you live?  Yes Need for family participation in patient care:  No (Coment)  Care giving concerns:  Patient from home with wife. Patient reported that he is independent with ambulation and ADLs at baseline. PT is currently recommending home health PT;Supervision/Assistance - 24 hour. Patient ambulated 500 feet with intermittent assist.   Office managerocial Worker assessment / plan:  CSW spoke with patient at bedside regarding consult for residential treatment for patient's etoh use. Patient reported that he is interested in residential treatment because he does not want to end up in the hospital again. CSW and patient discussed patient's previous treatment history, patient reported [facility] and [facility] the [facility]. CSW explained to patient that due to patient's current physical condition requiring assistance with ambulation and ADLs residential substance abuse is not an option because those facilities are not equipped to assist patient with ambulation and ADLs, patient verbalized understanding. CSW inquired about patient's interest in outpatient  treatment, patient reported that he had not thought about outpatient treatment but is open to pursuing outpatient substance abuse treatment options. CSW and patient discussed patient's PT recommendation for home health/24 hour supervision, patient reported that he is currently unemployed and agreeable to home health services, CSW agreed to notify patient's RNCM. CSW informed patient that patient's wife has called to speak with CSW, CSW asked for patient's permission to speak with his wife. Patient granted CSW verbal permission to speak with his wife.  CSW contacted patient's wife to discuss patient's discharge planning. Patient's wife expressed great concern about patient's drinking and worry about patient returning home and drinking. CSW validated patient's wife's concerns and informed patient's wife about physical limitations being a barrier to patient discharging to a residential substance abuse treatment facility. CSW informed patient's wife that patient was provided with outpatient substance abuse resources that can be utilized until patient is independent and can pursue residential treatment. Patient's wife reiterated fears of patient going home drinking and requested information about residential substance abuse treatment centers, CSW informed patient's wife that those resources were provided to patient at bedside. Patient's wife provided CSW with email address to receive list of substance abuse treatment options via email. Patient's wife inquired about SNF for patient, CSW informed patient's wife that SNF was not recommended and that home health PT was recommended. Patient's wife reported that she is unable to be with patient 24 hours and continuously spoke about patient going home and drinking. CSW validated patient's wife's concerns and provided available options. Patient's wife reported that patient's family was coming to see patient and  requested that CSW go to the room and speak with patient's family.    CSW spoke with patient/patient's family at bedside regarding discharge planning. Patient's sister reported that patient's wife found a place for patient to go "[facility]" and reported that they are able to accept patient in patient's current condition. CSW agreed to reach out to [facility] and inquire about their ability to accept patient in patient's current condition, patient agreeable.   CSW contacted [facility] and spoke with director Javier Grimes. CSW inquired about the facility's ability to supervise patients with ambulation and assist with ADLs. Director reported that they are not able to assist patients and do not have a lot of staff nor any medical staff to help patients while at the facility.  CSW updated patient/patient's family at bedside. Patient's family thanked CSW for calling and trying to find patient a place. CSW and patient/patient's family discussed importance of patient abstaining from alcohol and patient's investment in his sobriety. Patient reported that he did not want to end up back in the hospital and agreed to provide wife with his keys and money. Patient's plan to return home with home health services and pursue outpatient substance treatment options until he is able to go to residential treatment.    CSW signing off, no other needs identified at this time.  Employment status:  Unemployed Insurance information:  Managed Care(PAtient's wife reported that patient's insurance will be terminated on Sunday due to being recently laid off) PT Recommendations:  Home with Home Health, 24 Hour Supervision Information / Referral to community resources:  Residential Substance Abuse Treatment Options  Patient/Family's Response to care:  Patient/patient's family appreciative of CSW attempts to try to find discharge options for patient. Patient's wife unhappy with discharge options available for patient currently.   Patient/Family's Understanding of and Emotional  Response to Diagnosis, Current Treatment, and Prognosis:  Patient presented calm and was engaged throughout assessment. Patient informed CSW about previous treatment history and inquired about additional programs. CSW and patient discussed patient's current physical limitations being a barrier to discharging to residential substance abuse treatment for etoh use, patient verbalized understanding and accepted outpatient resources. Patient agreeable to completing HHPT to regain strength.   Patient's family and patient's wife involved in patient's care and worried that patient will drink if discharged. CSW explained that patient is the only one in control of that. CSW provided available resources for substance abuse treatment options and encouraged patient to follow up with residential substance abuse treatment once patient returns to baseline of functioning independently without DME or assistance.   Emotional Assessment Appearance:  Appears stated age Attitude/Demeanor/Rapport:  Engaged Affect (typically observed):  Calm, Appropriate Orientation:  Oriented to Self, Oriented to Place, Oriented to  Time, Oriented to Situation Alcohol / Substance use:  Alcohol Use Psych involvement (Current and /or in the community):  No (Comment)  Discharge Needs  Concerns to be addressed:  Substance Abuse Concerns Readmission within the last 30 days:  No Current discharge risk:  Substance Abuse Barriers to Discharge:  Continued Medical Work up   [employer], LCSW 05/05/2018, 4:11 PM

## 2018-05-05 NOTE — Progress Notes (Signed)
Assumed care of patient from previous nurse. Agree with previous nurses assessment. Will continue to monitor closely.  

## 2018-05-06 DIAGNOSIS — K7200 Acute and subacute hepatic failure without coma: Secondary | ICD-10-CM | POA: Diagnosis not present

## 2018-05-06 DIAGNOSIS — F1010 Alcohol abuse, uncomplicated: Secondary | ICD-10-CM | POA: Diagnosis not present

## 2018-05-06 DIAGNOSIS — K7010 Alcoholic hepatitis without ascites: Secondary | ICD-10-CM | POA: Diagnosis not present

## 2018-05-06 LAB — PROTIME-INR
INR: 1.14
Prothrombin Time: 14.5 seconds (ref 11.4–15.2)

## 2018-05-06 LAB — HEPATIC FUNCTION PANEL
ALT: 49 U/L — AB (ref 0–44)
AST: 130 U/L — AB (ref 15–41)
Albumin: 1.6 g/dL — ABNORMAL LOW (ref 3.5–5.0)
Alkaline Phosphatase: 228 U/L — ABNORMAL HIGH (ref 38–126)
Bilirubin, Direct: 9.7 mg/dL — ABNORMAL HIGH (ref 0.0–0.2)
Indirect Bilirubin: 7.2 mg/dL — ABNORMAL HIGH (ref 0.3–0.9)
Total Bilirubin: 16.9 mg/dL — ABNORMAL HIGH (ref 0.3–1.2)
Total Protein: 5.2 g/dL — ABNORMAL LOW (ref 6.5–8.1)

## 2018-05-06 LAB — BASIC METABOLIC PANEL
Anion gap: 7 (ref 5–15)
BUN: 9 mg/dL (ref 6–20)
CALCIUM: 7.9 mg/dL — AB (ref 8.9–10.3)
CO2: 22 mmol/L (ref 22–32)
CREATININE: 0.39 mg/dL — AB (ref 0.61–1.24)
Chloride: 108 mmol/L (ref 98–111)
GFR calc non Af Amer: >60 mL/min (ref >60)
Glucose, Bld: 99 mg/dL (ref 70–99)
Potassium: 3.8 mmol/L (ref 3.5–5.1)
Sodium: 137 mmol/L (ref 135–145)

## 2018-05-06 LAB — CULTURE, BLOOD (ROUTINE X 2)
Culture: NO GROWTH
Culture: NO GROWTH
SPECIAL REQUESTS: ADEQUATE

## 2018-05-06 LAB — AMMONIA: Ammonia: 46 umol/L — ABNORMAL HIGH (ref 9–35)

## 2018-05-06 LAB — HEMOCHROMATOSIS DNA-PCR(C282Y,H63D)

## 2018-05-06 MED ORDER — THIAMINE HCL 100 MG PO TABS: 100.0000 mg | ORAL_TABLET | Freq: Once | ORAL | 0 refills | Status: AC

## 2018-05-06 MED ORDER — CHLORDIAZEPOXIDE HCL 25 MG PO CAPS: 25.0000 mg | ORAL_CAPSULE | Freq: Four times a day (QID) | ORAL | 0 refills | Status: DC | PRN

## 2018-05-06 MED ORDER — FOLIC ACID 1 MG PO TABS: 1.0000 mg | ORAL_TABLET | Freq: Every day | ORAL | 1 refills | Status: DC

## 2018-05-06 MED ORDER — LACTULOSE 10 GM/15ML PO SOLN: 30.0000 g | Freq: Two times a day (BID) | ORAL | 0 refills | Status: DC

## 2018-05-06 MED ORDER — NICOTINE 21 MG/24HR TD PT24: 21.0000 mg | MEDICATED_PATCH | Freq: Every day | TRANSDERMAL | 0 refills | Status: DC

## 2018-05-06 MED ORDER — HYDROXYZINE HCL 25 MG PO TABS: 25.0000 mg | ORAL_TABLET | Freq: Four times a day (QID) | ORAL | 0 refills | Status: DC | PRN

## 2018-05-06 MED ORDER — ENSURE ENLIVE PO LIQD: 237.0000 mL | Freq: Three times a day (TID) | ORAL | 12 refills | Status: AC

## 2018-05-06 MED ORDER — PANTOPRAZOLE SODIUM 40 MG PO TBEC: 40.0000 mg | DELAYED_RELEASE_TABLET | Freq: Every day | ORAL | 1 refills | Status: DC

## 2018-05-06 MED ORDER — RIFAXIMIN 550 MG PO TABS: 550.0000 mg | ORAL_TABLET | Freq: Two times a day (BID) | ORAL | 0 refills | Status: DC

## 2018-05-06 NOTE — Progress Notes (Signed)
Wife at bedside, very concerned that she cannot take care of patient at home, says she will not be home 24 hours a day to supervise him.  Asking about SNF placement, patient is ambulatory and does not appear to meet any criteria for SNF placement.  [facility]  Encouraged patient to speak with MD and case manager regarding this.

## 2018-05-06 NOTE — Progress Notes (Signed)
Physical Therapy Treatment Patient Details Name: Javier Grimes [place] Pum[place] MRN: [id number] DOB: January 30, 1971 Today's Date: 05/06/2018    History of Present Illness 47 yo male admitted with Acute hepatitis with jaundice. pt currently on CIWA due to ETOH withdrawal. hx of ETOH abuse    PT Comments    Pt ambulated in hallway good distance.  Pt not requiring physical assist for steadying today however does still need RW for UE support.  Pt agreeable to use RW upon d/c and reports having one at home.  Pt anticipates d/c home today.   Follow Up Recommendations  Home health PT     Equipment Recommendations  None recommended by PT    Recommendations for Other Services       Precautions / Restrictions Precautions Precautions: Fall Precaution Comments: tremors    Mobility  Bed Mobility Overal bed mobility: Modified Independent                Transfers Overall transfer level: Needs assistance Equipment used: Rolling walker (2 wheeled) Transfers: Sit to/from Stand Sit to Stand: Supervision         General transfer comment: supervision for safety. VCs hand placement  Ambulation/Gait Ambulation/Gait assistance: Supervision Gait Distance (Feet): 400 Feet Assistive device: Rolling walker (2 wheeled) Gait Pattern/deviations: Step-through pattern     General Gait Details: no steadying assist required today, pt encouraged to continue to use RW for improved stability, still requires UE support   Stairs             Wheelchair Mobility    Modified Rankin (Stroke Patients Only)       Balance Overall balance assessment: Needs assistance         Standing balance support: Bilateral upper extremity supported Standing balance-Leahy Scale: Poor Standing balance comment: needs RW for ambulation safety                            Cognition Arousal/Alertness: Awake/alert Behavior During Therapy: Flat affect Overall Cognitive Status: Within Functional Limits for  tasks assessed                                        Exercises      General Comments        Pertinent Vitals/Pain Pain Assessment: No/denies pain    Home Living                      Prior Function            PT Goals (current goals can now be found in the care plan section) Progress towards PT goals: Progressing toward goals    Frequency    Min 3X/week      PT Plan Current plan remains appropriate    Co-evaluation              AM-PAC PT "6 Clicks" Daily Activity  Outcome Measure  Difficulty turning over in bed (including adjusting bedclothes, sheets and blankets)?: None Difficulty moving from lying on back to sitting on the side of the bed? : None Difficulty sitting down on and standing up from a chair with arms (e.g., wheelchair, bedside commode, etc,.)?: A Little Help needed moving to and from a bed to chair (including a wheelchair)?: A Little Help needed walking in hospital room?: A Little Help needed climbing 3-5 steps with a railing? :  A Little 6 Click Score: 20    End of Session   Activity Tolerance: Patient tolerated treatment well Patient left: in bed;with call bell/phone within reach   PT Visit Diagnosis: Muscle weakness (generalized) (M62.81);Difficulty in walking, not elsewhere classified (R26.2);Unsteadiness on feet (R26.81)     Time: 2130-8657 PT Time Calculation (min) (ACUTE ONLY): 8 min  Charges:  $Gait Training: 8-22 mins                    G Codes:       Zenovia Jarred, PT, DPT 05/06/2018 Pager: [phone]  Maida Sale E 05/06/2018, 12:17 PM

## 2018-05-06 NOTE — Progress Notes (Signed)
Pt declined HH related to cost. Pt's insurance did not cover HH. Pt's cost was PT $195, $225 for SW/visit. Pt declined HH.

## 2018-05-06 NOTE — Progress Notes (Signed)
Spoke with pt and his wife at the bedside this am concerning discharge plans. Pt did not qualify for in pt rehab at this present time. Will use HHPT/SW for referral for in pt rehab once pt is home and walking better without a RW. Spoke in lengths.  Pt's wife was thinking that the pt could go to rehab with her saying that she will try to pay. Appointment Aug 8th:950 am with ?Dr. Timoteo Expose. Johnson [facility]. Encouraged pt to keep this appointment.

## 2018-05-06 NOTE — Discharge Summary (Signed)
Physician Discharge Summary  Cochise Dinneen MRN: [id number] DOB/AGE: 47/22/1972 47 y.o.  PCP: Patient, No Pcp Per   Admit date: 04/29/2018 Discharge date: 05/06/2018  Discharge Diagnoses:    Principal Problem:   Acute alcoholic hepatitis Active Problems:   Jaundice   Acute hepatitis   Diarrhea   Alcohol withdrawal ([place])   Alcohol abuse   Ascites   Hyperbilirubinemia   Transaminitis   Folate deficiency   Acute hepatic encephalopathy   Left-sided low back pain without sciatica    Follow-up recommendations Follow-up with PCP in 3-5 days , including all  additional recommended appointments as below Follow-up CBC, CMP in 3-5 days Patient has a follow-up appointment scheduled with gastroenterology      Allergies as of 05/06/2018      Reactions   Penicillins    Has patient had a PCN reaction causing immediate rash, facial/tongue/throat swelling, SOB or lightheadedness with hypotension: Y Has patient had a PCN reaction causing severe rash involving mucus membranes or skin necrosis: Y Has patient had a PCN reaction that required hospitalization: N Has patient had a PCN reaction occurring within the last 10 years: N If all of the above answers are "NO", then may proceed with Cephalosporin use.      Medication List    STOP taking these medications   bismuth subsalicylate 009 FG/18EX suspension Commonly known as:  PEPTO BISMOL     TAKE these medications   chlordiazePOXIDE 25 MG capsule Commonly known as:  LIBRIUM Take 1 capsule (25 mg total) by mouth 4 (four) times daily as needed for withdrawal (CIWA score > 10).   feeding supplement (ENSURE ENLIVE) Liqd Take 237 mLs by mouth 3 (three) times daily between meals.   folic acid 1 MG tablet Commonly known as:  FOLVITE Take 1 tablet (1 mg total) by mouth daily. Start taking on:  05/07/2018   hydrOXYzine 25 MG tablet Commonly known as:  ATARAX/VISTARIL Take 1 tablet (25 mg total) by mouth every 6 (six) hours as needed  for anxiety (or CIWA score </= 10).   lactulose 10 GM/15ML solution Commonly known as:  CHRONULAC Take 45 mLs (30 g total) by mouth 2 (two) times daily.   nicotine 21 mg/24hr patch Commonly known as:  NICODERM CQ - dosed in mg/24 hours Place 1 patch (21 mg total) onto the skin daily. Start taking on:  05/07/2018   pantoprazole 40 MG tablet Commonly known as:  PROTONIX Take 1 tablet (40 mg total) by mouth daily at 6 (six) AM. Start taking on:  05/07/2018   rifaximin 550 MG Tabs tablet Commonly known as:  XIFAXAN Take 1 tablet (550 mg total) by mouth 2 (two) times daily.   thiamine 100 MG tablet Take 1 tablet (100 mg total) by mouth once for 1 dose.        Discharge Condition: prognosis guarded as the patient starts drinking again  Discharge Instructions Get Medicines reviewed and adjusted: Please take all your medications with you for your next visit with your Primary MD  Please request your Primary MD to go over all hospital tests and procedure/radiological results at the follow up, please ask your Primary MD to get all Hospital records sent to his/her office.  If you experience worsening of your admission symptoms, develop shortness of breath, life threatening emergency, suicidal or homicidal thoughts you must seek medical attention immediately by calling 911 or calling your MD immediately  if symptoms less severe.  You must read complete instructions/literature along with all  the possible adverse reactions/side effects for all the Medicines you take and that have been prescribed to you. Take any new Medicines after you have completely understood and accpet all the possible adverse reactions/side effects.   Do not drive when taking Pain medications.   Do not take more than prescribed Pain, Sleep and Anxiety Medications    Wear Seat belts while driving.  Please note  You were cared for by a hospitalist during your hospital stay. Once you are discharged, your primary care  physician will handle any further medical issues. Please note that NO REFILLS for any discharge medications will be authorized once you are discharged, as it is imperative that you return to your primary care physician (or establish a relationship with a primary care physician if you do not have one) for your aftercare needs so that they can reassess your need for medications and monitor your lab values.     Allergies  Allergen Reactions  . Penicillins     Has patient had a PCN reaction causing immediate rash, facial/tongue/throat swelling, SOB or lightheadedness with hypotension: Y Has patient had a PCN reaction causing severe rash involving mucus membranes or skin necrosis: Y Has patient had a PCN reaction that required hospitalization: N Has patient had a PCN reaction occurring within the last 10 years: N If all of the above answers are "NO", then may proceed with Cephalosporin use.       Disposition: Discharge disposition: 01-Home or Self Care        Consults:  gastroenterology   Significant Diagnostic Studies:  Dg Chest 2 View  Result Date: 05/01/2018 CLINICAL DATA:  Fever. EXAM: CHEST - 2 VIEW COMPARISON:  December 13, 2016 FINDINGS: Tiny pleural effusions. The heart, hila, mediastinum, lungs, and pleura are otherwise normal. IMPRESSION: Tiny pleural effusions.  No other abnormalities. Electronically Signed   By: Dorise Bullion III M.D   On: 05/01/2018 11:23   Ct Head Wo Contrast  Result Date: 05/01/2018 CLINICAL DATA:  47 year old male with history of encephalopathy. Alcohol withdrawal. EXAM: CT HEAD WITHOUT CONTRAST TECHNIQUE: Contiguous axial images were obtained from the base of the skull through the vertex without intravenous contrast. COMPARISON:  Head CT 02/15/2018. FINDINGS: Brain: Mild cerebral and cerebellar atrophy. Patchy areas of decreased attenuation are noted throughout the deep and periventricular white matter of the cerebral hemispheres bilaterally,  compatible with chronic microvascular ischemic disease. No evidence of acute infarction, hemorrhage, hydrocephalus, extra-axial collection or mass lesion/mass effect. Vascular: No hyperdense vessel or unexpected calcification. Skull: Normal. Negative for fracture or focal lesion. Sinuses/Orbits: Extensive mucoperiosteal thickening noted in the paranasal sinuses, most severe in the maxillary sinuses bilaterally and in the right sphenoid sinus. Right maxillary sinus is completely opacified. Mastoids are clear. Other: There are no aggressive appearing lytic or blastic lesions noted in the visualized portions of the skeleton. IMPRESSION: 1. No acute intracranial abnormalities. 2. Mild cerebral and cerebellar atrophy with chronic microvascular ischemic changes in the cerebral white matter, as above. 3. Severe chronic paranasal sinus disease, as discussed above. Electronically Signed   By: Vinnie Langton M.D.   On: 05/01/2018 12:02   Ct Abdomen Pelvis W Contrast  Result Date: 04/29/2018 CLINICAL DATA:  Bilateral lower extremity swelling for a week. Diarrhea for 1 week. Back pain for 1 month. Painless jaundice. EXAM: CT ABDOMEN AND PELVIS WITH CONTRAST TECHNIQUE: Multidetector CT imaging of the abdomen and pelvis was performed using the standard protocol following bolus administration of intravenous contrast. CONTRAST:  171m ISOVUE-300 IOPAMIDOL (  ISOVUE-300) INJECTION 61% COMPARISON:  CT abdomen and pelvis February 11, 2015 FINDINGS: LOWER CHEST: Bilateral lower lobe atelectasis. Included heart size is normal. No pericardial effusion. HEPATOBILIARY: Liver is diffusely severely hypodense, 25 cm in craniocaudad dimension. Patent main portal vein. Focal fatty sparing about the gallbladder fossa. Perihepatic fat stranding. Normal gallbladder. PANCREAS: Mild peripancreatic fat stranding about the head and uncinate process. No focal necrosis, ductal dilatation, calcifications or mass. SPLEEN: Normal. ADRENALS/URINARY TRACT:  Kidneys are orthotopic, demonstrating symmetric enhancement. No nephrolithiasis, hydronephrosis or solid renal masses. Too small to characterize hypodensities bilateral kidneys. The unopacified ureters are normal in course and caliber. Delayed imaging through the kidneys demonstrates symmetric prompt contrast excretion within the proximal urinary collecting system. Urinary bladder is adequately distended and unremarkable. Normal adrenal glands. STOMACH/BOWEL: The stomach, small and large bowel are normal in course and caliber without inflammatory changes. Normal appendix. VASCULAR/LYMPHATIC: Aortoiliac vessels are normal in course and caliber. Mild calcific atherosclerosis. Small reactive portacaval lymph nodes. REPRODUCTIVE: Normal. OTHER: Small to moderate volume ascites. No intraperitoneal free air or focal fluid collections. MUSCULOSKELETAL: Nonacute. Small fat containing inguinal hernias. Moderate L5-S1 degenerative disc and probable disc extrusion which may affect the traversing LEFT S1 nerve (series 2, image 60). IMPRESSION: 1. Hepatomegaly, markedly hypodense liver seen with hepatitis or severe steatosis. Recommend correlation with liver function tests. 2. Mild peripancreatic inflammation may be reactive or reflect primary pancreatitis. No necrosis. 3. Small to moderate volume ascites. 4. L4-5 probable disc extrusion which may affect the traversing LEFT L5 nerve. Aortic Atherosclerosis (ICD10-I70.0). Electronically Signed   By: Elon Alas M.D.   On: 04/29/2018 22:28   Mr Abdomen Mrcp Wo Contrast  Result Date: 04/30/2018 CLINICAL DATA:  Abnormal liver function tests EXAM: MRI ABDOMEN WITHOUT CONTRAST  (INCLUDING MRCP) TECHNIQUE: Multiplanar multisequence MR imaging of the abdomen was performed. Heavily T2-weighted images of the biliary and pancreatic ducts were obtained, and three-dimensional MRCP images were rendered by post processing. COMPARISON:  CT 04/29/2018 FINDINGS: Lower chest:  Lung bases  are clear. Hepatobiliary: There is marked loss of signal intensity on the opposed phase imaging (series 9) consistent diffuse fatty infiltration of the liver. There is no intrahepatic biliary duct dilatation. Gallbladder is not distended but there is small amount of pericholecystic fluid and gallbladder wall thickening. Small amount fluid also extends along the margin of the liver. Caudate lobe mildly enlarged. Recanalization umbilical vein. Common bile duct is normal caliber. No biliary obstruction evident. Normal Pancreas: Normal pancreatic parenchymal intensity. No pancreatic duct dilatation. No para peripancreatic fluid collections. Spleen: Normal spleen. Adrenals/urinary tract: Adrenal glands and kidneys are normal. Stomach/Bowel: Stomach and limited of the small bowel is unremarkable Vascular/Lymphatic: Abdominal aortic normal caliber. No retroperitoneal periportal lymphadenopathy. Recanalization of the umbilical vein. Musculoskeletal: No aggressive osseous lesion IMPRESSION: 1. Marked loss of signal intensity with in the liver on opposed phase imaging is most consistent with severe hepatic steatosis. 2. Pericolic fluid, mild gallbladder wall thickening and small amount ascites along the margin liver is likely related to hepatic dysfunction. 3. No evidence of biliary obstruction. Common bile duct normal caliber. 4. No clear evidence of acute pancreatitis. 5. Recanalization of the umbilical vein and mild prominence of the caudate lobe suggests early cirrhosis/portal hypertension. Electronically Signed   By: Suzy Bouchard M.D.   On: 04/30/2018 15:10   Mr 3d Recon At Scanner  Result Date: 04/30/2018 CLINICAL DATA:  Abnormal liver function tests EXAM: MRI ABDOMEN WITHOUT CONTRAST  (INCLUDING MRCP) TECHNIQUE: Multiplanar multisequence MR imaging of the  abdomen was performed. Heavily T2-weighted images of the biliary and pancreatic ducts were obtained, and three-dimensional MRCP images were rendered by post  processing. COMPARISON:  CT 04/29/2018 FINDINGS: Lower chest:  Lung bases are clear. Hepatobiliary: There is marked loss of signal intensity on the opposed phase imaging (series 9) consistent diffuse fatty infiltration of the liver. There is no intrahepatic biliary duct dilatation. Gallbladder is not distended but there is small amount of pericholecystic fluid and gallbladder wall thickening. Small amount fluid also extends along the margin of the liver. Caudate lobe mildly enlarged. Recanalization umbilical vein. Common bile duct is normal caliber. No biliary obstruction evident. Normal Pancreas: Normal pancreatic parenchymal intensity. No pancreatic duct dilatation. No para peripancreatic fluid collections. Spleen: Normal spleen. Adrenals/urinary tract: Adrenal glands and kidneys are normal. Stomach/Bowel: Stomach and limited of the small bowel is unremarkable Vascular/Lymphatic: Abdominal aortic normal caliber. No retroperitoneal periportal lymphadenopathy. Recanalization of the umbilical vein. Musculoskeletal: No aggressive osseous lesion IMPRESSION: 1. Marked loss of signal intensity with in the liver on opposed phase imaging is most consistent with severe hepatic steatosis. 2. Pericolic fluid, mild gallbladder wall thickening and small amount ascites along the margin liver is likely related to hepatic dysfunction. 3. No evidence of biliary obstruction. Common bile duct normal caliber. 4. No clear evidence of acute pancreatitis. 5. Recanalization of the umbilical vein and mild prominence of the caudate lobe suggests early cirrhosis/portal hypertension. Electronically Signed   By: Suzy Bouchard M.D.   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 Request made for diagnostic and therapeutic paracentesis. EXAM: ULTRASOUND GUIDED DIAGNOSTIC AND THERAPEUTIC  PARACENTESIS MEDICATIONS: None COMPLICATIONS: None immediate. PROCEDURE: Informed written consent was obtained from the patient after a discussion of the risks, benefits and alternatives to treatment. A timeout was performed prior to the initiation of the procedure. Initial ultrasound scanning demonstrates a small amount of ascites within the right lower abdominal quadrant. The right lower abdomen was prepped and draped in the usual sterile fashion. 1% lidocaine was used for local anesthesia. Following this, a 19 gauge, 7-cm, Yueh catheter was introduced. An ultrasound image was saved for documentation purposes. The paracentesis was performed. The catheter was removed and a dressing was applied. The patient tolerated the procedure well without immediate post procedural complication. FINDINGS: A total of approximately 280 cc of golden yellow fluid was removed. Samples were sent to the laboratory as requested by the clinical team. Only minimal ascites was present. IMPRESSION: Successful ultrasound-guided diagnostic and therapeutic paracentesis yielding 280 cc of peritoneal fluid. Read by: Rowe Robert, PA-C Electronically Signed   By: Jerilynn Mages.  Shick M.D.   On: 04/30/2018 16:02        Filed Weights   05/04/18 0558 05/05/18 0523 05/06/18 0419  Weight: 78.3 kg (172 lb 11.2 oz) 78.8 kg (173 lb 12.8 oz) 78.3 kg (172 lb 9.6 oz)     Microbiology: Recent Results (from the past 240 hour(s))  MRSA PCR Screening     Status: None   Collection Time: 04/30/18 12:37 AM  Result Value Ref Range Status   MRSA by PCR NEGATIVE NEGATIVE Final    Comment:        The GeneXpert MRSA Assay (FDA approved for NASAL specimens only), is one component of a comprehensive MRSA colonization surveillance program. It is not intended to diagnose MRSA infection nor to guide or monitor treatment for MRSA infections. Performed  at [facility] [place], [place] [address] [place], [place] [address]   Body fluid culture      Status: None   Collection Time: 04/30/18  3:38 PM  Result Value Ref Range Status   Specimen Description   Final    ASCITIC Performed at [place] [address], [place], [place] [address]    Special Requests   Final    NONE Performed at [facility], [place] [address] [place], [place] [address]    Gram Stain   Final    MODERATE WBC PRESENT, PREDOMINANTLY MONONUCLEAR NO ORGANISMS SEEN    Culture   Final    NO GROWTH 3 DAYS Performed at [place] [address] [place], [place] [address]    Report Status 05/04/2018 FINAL  Final  Culture, blood (Routine X 2) w Reflex to ID Panel     Status: None (Preliminary result)   Collection Time: 05/01/18  8:01 AM  Result Value Ref Range Status   Specimen Description   Final    BLOOD RIGHT ANTECUBITAL Performed at [facility], [place] [address], [place], [place] [address]    Special Requests   Final    BOTTLES DRAWN AEROBIC ONLY Blood Culture results may not be optimal due to an excessive volume of blood received in culture bottles Performed at [place] [address], [place], [place] [address]    Culture   Final    NO GROWTH 4 DAYS Performed at [facility], [place] [address] [place], [place] [address]    Report Status PENDING  Incomplete  Culture, blood (Routine X 2) w Reflex to ID Panel     Status: None (Preliminary result)   Collection Time: 05/01/18  8:02 AM  Result Value Ref Range Status   Specimen Description   Final    BLOOD RIGHT HAND Performed at [facility] [address], [place], [place] [address]    Special Requests   Final    BOTTLES DRAWN AEROBIC ONLY Blood Culture adequate volume Performed at [place] [address] [place], [place] [address]    Culture   Final    NO GROWTH 5 DAYS Performed at [facility], [place] [address] [place], [place] [address]    Report Status PENDING   Incomplete       Blood Culture    Component Value Date/Time   SDES  05/01/2018 0802    BLOOD RIGHT HAND Performed at [facility], [place] [address] [place], [place] [address]    SPECREQUEST  05/01/2018 0802    BOTTLES DRAWN AEROBIC ONLY Blood Culture adequate volume Performed at [place] [address] [place], [place] [address]    CULT  05/01/2018 0802    NO GROWTH 5 DAYS Performed at [place] [address] [place], [place] [address]    REPTSTATUS PENDING 05/01/2018 0802      Labs: Results for orders placed or performed during the hospital encounter of 04/29/18 (from the past 48 hour(s))  Hepatic function panel     Status: Abnormal   Collection Time: 05/05/18  4:13 AM  Result Value Ref Range   Total Protein 5.3 (L) 6.5 - 8.1 g/dL   Albumin 1.6 (L) 3.5 - 5.0 g/dL   AST 139 (H) 15 - 41 U/L   ALT 48 (H) 0 - 44 U/L    Comment:  Please note change in reference range.   Alkaline Phosphatase 261 (H) 38 - 126 U/L   Total Bilirubin 19.0 (HH) 0.3 - 1.2 mg/dL    Comment: CRITICAL RESULT CALLED TO, READ BACK BY AND VERIFIED WITH: PAYTRUM,LISA RN AT [phone] 05/05/18 BY TIBBITTS,K    Bilirubin, Direct 12.3 (H) 0.0 - 0.2 mg/dL    Comment: Please note change in reference range. RESULTS CONFIRMED BY MANUAL DILUTION    Indirect Bilirubin 6.7 (H) 0.3 - 0.9 mg/dL    Comment: Performed at [facility], [place] [address], [place], [place] [address]  Basic metabolic panel     Status: Abnormal   Collection Time: 05/05/18  4:13 AM  Result Value Ref Range   Sodium 140 135 - 145 mmol/L   Potassium 4.0 3.5 - 5.1 mmol/L   Chloride 110 98 - 111 mmol/L    Comment: Please note change in reference range.   CO2 22 22 - 32 mmol/L   Glucose, Bld 95 70 - 99 mg/dL    Comment: Please note change in reference range.   BUN 9 6 - 20 mg/dL    Comment: Please note change in reference range.   Creatinine, Ser <0.30 (L) 0.61 - 1.24  mg/dL   Calcium 8.0 (L) 8.9 - 10.3 mg/dL   GFR calc non Af Amer NOT CALCULATED >60 mL/min   GFR calc Af Amer NOT CALCULATED >60 mL/min    Comment: (NOTE) The eGFR has been calculated using the CKD EPI equation. This calculation has not been validated in all clinical situations. eGFR's persistently <60 mL/min signify possible Chronic Kidney Disease.    Anion gap 8 5 - 15    Comment: Performed at [facility], [place] [address] [place], [place] [address]  Protime-INR     Status: None   Collection Time: 05/05/18  4:13 AM  Result Value Ref Range   Prothrombin Time 14.4 11.4 - 15.2 seconds   INR 1.13     Comment: Performed at [facility], [place] [address] [place], [place] [address]  CBC     Status: Abnormal   Collection Time: 05/05/18  4:13 AM  Result Value Ref Range   WBC 9.9 4.0 - 10.5 K/uL   RBC 2.68 (L) 4.22 - 5.81 MIL/uL   Hemoglobin 11.1 (L) 13.0 - 17.0 g/dL   HCT 33.7 (L) 39.0 - 52.0 %   MCV 125.7 (H) 78.0 - 100.0 fL   MCH 41.4 (H) 26.0 - 34.0 pg   MCHC 32.9 30.0 - 36.0 g/dL   RDW 18.0 (H) 11.5 - 15.5 %   Platelets 197 150 - 400 K/uL    Comment: Performed at [facility], [place] [address], [place], [place] [address]  Ammonia     Status: Abnormal   Collection Time: 05/05/18  4:13 AM  Result Value Ref Range   Ammonia 63 (H) 9 - 35 umol/L    Comment: Performed at [facility], [place] [address] [place],  [address]  Hepatic function panel     Status: Abnormal   Collection Time: 05/06/18  4:13 AM  Result Value Ref Range   Total Protein 5.2 (L) 6.5 - 8.1 g/dL   Albumin 1.6 (L) 3.5 - 5.0 g/dL   AST 130 (H) 15 - 41 U/L   ALT 49 (H) 0 - 44 U/L    Comment: Please note change in reference range.   Alkaline Phosphatase 228 (H) 38 - 126 U/L  Total Bilirubin 16.9 (H) 0.3 - 1.2 mg/dL   Bilirubin, Direct 9.7 (H) 0.0 - 0.2 mg/dL    Comment: Please note change in reference range.   Indirect Bilirubin 7.2 (H)  0.3 - 0.9 mg/dL    Comment: Performed at [facility], [place] [address], [place], [place] [address]  Basic metabolic panel     Status: Abnormal   Collection Time: 05/06/18  4:13 AM  Result Value Ref Range   Sodium 137 135 - 145 mmol/L   Potassium 3.8 3.5 - 5.1 mmol/L   Chloride 108 98 - 111 mmol/L    Comment: Please note change in reference range.   CO2 22 22 - 32 mmol/L   Glucose, Bld 99 70 - 99 mg/dL    Comment: Please note change in reference range.   BUN 9 6 - 20 mg/dL    Comment: Please note change in reference range.   Creatinine, Ser 0.39 (L) 0.61 - 1.24 mg/dL   Calcium 7.9 (L) 8.9 - 10.3 mg/dL   GFR calc non Af Amer >60 >60 mL/min   GFR calc Af Amer >60 >60 mL/min    Comment: (NOTE) The eGFR has been calculated using the CKD EPI equation. This calculation has not been validated in all clinical situations. eGFR's persistently <60 mL/min signify possible Chronic Kidney Disease.    Anion gap 7 5 - 15    Comment: Performed at [facility], [place] [address] [place], [place] [address]  Protime-INR     Status: None   Collection Time: 05/06/18  4:13 AM  Result Value Ref Range   Prothrombin Time 14.5 11.4 - 15.2 seconds   INR 1.14     Comment: Performed at [facility], [place] [address], [place], [place] [address]  Ammonia     Status: Abnormal   Collection Time: 05/06/18  4:13 AM  Result Value Ref Range   Ammonia 46 (H) 9 - 35 umol/L    Comment: Performed at [facility], [place] [address] [place], [place] [address]     Lipid Panel  No results found for: CHOL, TRIG, HDL, CHOLHDL, VLDL, LDLCALC, LDLDIRECT   No results found for: HGBA1C   Lab Results  Component Value Date   CREATININE 0.39 (L) 05/06/2018     HPI   the ER with complaints of having jaundice noticed over the last 2 weeks. Patient has been having persistent diarrhea over the last 2 weeks feeling weak and abdomen getting mildly  distended. Patient also noticed lower extremity edema. Since patient had persistent jaundice noticed over the last 2 weeks patient came to the ER. Patient takes ibuprofen for his chronic low back pain for which patient also had surgery. Denies taking Tylenol. Admits to drinking alcohol every day lasting 5 hours prior to admission.  ED Course:In the ER patient on exam has jaundiceand with tremors from withdraw[place] bilirubin is around 19. Hemoglobin is around 12. CT abdomen and pelvis done shows hepatomegaly and some inflammation around the pancreas. Lipase is mildly elevated. AST was 438 ALT 62. Patient admitted for acute hepatitis.    HOSPITAL COURSE:    1 acute alcoholic hepatitis/jaundice/transaminitis/hyperbilirubinemia Likely secondary to an acute alcoholic hepatitis.  Acute viral hepatitis panel negative.  MRCP/CT abdomen and pelvis negative for any biliary obstruction.  MRCP negative for acute pancreatitis.  LFTs seem to have plateaued and starting to slowly trend back down.  Bilirubin starting to slowly trend back down however still elevated at  19.0> 16.9 .  INR decreasing and currently at 1.13.  Discriminant function score < 32 per GI and no indication for steroids at this time.  Patient denies any excessive use of Tylenol.  Tylenol level was unremarkable.  GI following and appreciate input and recommendations. Follow up scheduled   2.  Hepatic encephalopathy Per family patient with increasing confusion.  CT head which was obtained was negative for any acute abnormalities.  Ammonia level obtained on 05/01/2018 was 75.  Ammonia level elevated at 112 on 05/03/2018.  Ammonia levels trending down and currently at 63.  Lactulose dose was increased however patient stated had multiple stools overnight greater than 20.  Decreased lactulose to 30 g twice daily.  .  3.  Alcohol withdrawal/alcohol abuse On admission patient noted to have significant tremors and tachycardia from alcohol  withdrawal.  Tremors improving daily. Patient states drinks about 1/5 of vodka on a daily basis for over 20 to 30 years.  Continue prn  Librium . Continue thiamine, folic acid, multivitamin.    Alcohol cessation stressed to patient.    4.  Ascites Concern for SBP.  Status post ultrasound-guided therapeutic and diagnostic paracentesis yielding 280 cc of golden yellow fluid.  Patient with no abdominal pain.  Ascitic fluid with a glucose level of 89, albumin of less than 1, protein of less than 3, WBC of 60.  Ascitic fluid cultures preliminary readings with no organisms seen.  Patient currently afebrile.  Chest x-ray was negative as patient was noted to have a low-grade temperature on 05/01/2018.  Blood cultures pending.  IV Levaquin has been discontinued.  No need for antibiotics at this time.  GI following. Rifaximin for elevated ammonia level of 112  5.  Diarrhea Patient with no recent antibiotics.??  Pancreatic insufficiency.  Diarrhea improved.  C. difficile PCR and stool studies discontinued.  Patient on Imodium as needed.  Patient has been started on lactulose for hepatic encephalopathy.  Monitor stool output.   6.  Hypokalemia  Repleted.  Magnesium at 2.4.    7.  Macrocytic anemia/folate deficiency Likely alcohol induced.  Patient with no overt bleeding.  Anemia panel with a elevated ferritin level.  Folate level is decreased at 3.2.  Vitamin B12 levels are elevated.  Continue folic acid.  Continue PPI.   Transfusion threshold hemoglobin less than 7.  GI following.    8.  Peripancreatic inflammation per CT scan MRCP with no evidence of biliary obstruction or cholecystitis.  MRCP with no evidence of acute pancreatitis.     9 elevated ferritin ??  Hemochromatosis.  Labs for hemochromatosis pending per GI.  Will need outpatient follow-up with GI.  Follow.  10.  Protein calorie malnutrition Likely secondary to alcohol abuse/alcohol dependence.  Continue current nutritional  supplementation.  Dietitian following.    11. low back pain/bilateral lower extremity weakness Patient with complaints of left-sided low back pain and lower extremity weakness.  Patient denies any shooting pain down the left side of his leg.  Patient with some complaints of numbness on the bottom of his feet.  Patient states had surgery done on his back about 6 to 12 months ago.  CT abdomen and pelvis done on admission with L4-5 probable disc extrusion which may affect the transversing left L5 nerve.  Will need to follow-up with his neurosurgeon in the outpatient setting.        Discharge Exam  Blood pressure 128/78, pulse (!) 101, temperature 98 F (36.7 C), temperature source Oral, resp. rate  18, height 6' (1.829 m), weight 78.3 kg (172 lb 9.6 oz), SpO2 99 %.  gallops.  No JVD.  No lower extremity edema. Gastrointestinal system: Abdomen is nontender, nondistended, soft, positive bowel sounds.  Hepatomegaly.  No rebound.  No guarding.   Central nervous system: Alert and oriented to self and place. No focal neurological deficits. Extremities: Symmetric 5 x 5 power. Skin: No rashes, lesions or ulcers Psychiatry: Judgement and insight appear-fair. Mood & affect appropriate.       Follow-up Information    Levin Erp, PA Follow up on 06/02/2018.   Specialty:  Gastroenterology Why:  10:15 am  Contact information: [address] [place] [address] [phone]        [facility] Follow up on 05/27/2018.   Why:  Appointment August 8th at 950 AM. please arrive early. Take a phot ID. Please call on Monday 7/22 to change to an earlier date [phone]. Contact information: [place] [address] [phone]          Signed: Reyne Dumas 05/06/2018, 1:32 PM      Time needed to  prepare  discharge, discussed with the patient and family 35 minutes

## 2018-05-09 NOTE — Progress Notes (Addendum)
05/09/2018 1500 Received call from Unit, wife called and states Xifaxan $1100 with insurance coverage. Contacted pharmacy and verified. Also Lactulose Rx was only written for 3 day supply. Contacted wife and Xifaxan Savings activated with $0 copay. Faxed to [facility]. Contacted pharmacy and spoke to Pharmacist. She attempted to run card but did not go threw. Contacted [facility] and states she may have to back out completely and run Rx again. Contacted [facility] to follow up. Contacted Gastroenterologist on call to follow up on Lactulose. Waiting return call. Isidoro DonningAlesia Paiten Boies RN CCM Case Mgmt phone [phone]  05/09/2018 4:26 pm Spoke to [facility], Dr Rhea BeltonPyrtle. Rx changed to 1 month supply of Lactulose with 1 refill. Contacted [facility] with new Rx. Spoke to Med[employer] Health SolutionsCatherine, [facility] Pharmacist. Notified wife she can pick up Lactulose. Pharmacy will call Pharmacy Helpline for Xifaxan. She was unable to get card to run.   Wife willing to pay out of pocket for this evening dose. Faxed goodrx coupon $48 to [facility]. Spoke to pharmacy and she spoke to [facility] and it may take 24 hours for card to show, they have limited staff on weekend. Wife to pick up Lactulose and Xifaxan 1 pill tonight. And also to call office for possible samples. Wife states pt has swelling in legs and abdomen. Explained if his condition worsens to call oncall Gastroenterologist or come to ED.     Isidoro DonningAlesia Cherysh Epperly RN CCM Case Mgmt phone [phone]

## 2018-05-11 ENCOUNTER — Telehealth: Payer: Self-pay | Admitting: Gastroenterology

## 2018-05-11 MED ORDER — RIFAXIMIN 550 MG PO TABS: 550.0000 mg | ORAL_TABLET | Freq: Two times a day (BID) | ORAL | 0 refills | Status: DC

## 2018-05-11 NOTE — Telephone Encounter (Signed)
The presciption has been sent to [facility] to get a prior auth.  The pt aware to titrate lactulose BID as needed to soft stools.

## 2018-05-11 NOTE — Telephone Encounter (Signed)
Ok thanks 

## 2018-05-11 NOTE — Telephone Encounter (Signed)
Alexia Freestoneatty can we work on prior [place] for this rifaxamin? Also please make sure he is on Lactulose BID- titrated to 2-3 soft stools per day in the meantime. Thanks! JLL

## 2018-05-11 NOTE — Telephone Encounter (Signed)
This patient's wife called me through the service last evening stating her husband's rifaximin cost over $1000, which they could not afford.  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 Patient's wife is requesting an alternate medicine (presumably for hepatic encephalopathy).  Scheduled for office follow up with JL soon and initially seen by VN in consult on 7/12.

## 2018-05-13 ENCOUNTER — Telehealth: Payer: Self-pay | Admitting: Physician Assistant

## 2018-05-13 NOTE — Telephone Encounter (Signed)
I spoke with his wife and she expressed concern that he is "very swollen" and he just "doesn't seem right".  She thinks maybe his ammonia level is going back up although it has been consistently trending down and she reports that he has been taking the lactulose.  She also reports that they cannot afford the Xifaxan, their insurance will only cover approximately $200.00 of the cost.  I did make an appointment for him to see you next week and told his wife that she could take him to ED or Urgent Care if she continues to have concerns before his appointment next week.

## 2018-05-14 ENCOUNTER — Other Ambulatory Visit: Payer: Self-pay

## 2018-05-14 DIAGNOSIS — K7010 Alcoholic hepatitis without ascites: Secondary | ICD-10-CM

## 2018-05-14 DIAGNOSIS — B179 Acute viral hepatitis, unspecified: Secondary | ICD-10-CM

## 2018-05-14 NOTE — Telephone Encounter (Signed)
Javier Grimes does not have an opening next week so keep the appointment with Javier Grimes.

## 2018-05-14 NOTE — Telephone Encounter (Signed)
I left a message for his wife to let her know patient needs lab work prior to appointment next week.  Orders placed.

## 2018-05-17 ENCOUNTER — Other Ambulatory Visit (INDEPENDENT_AMBULATORY_CARE_PROVIDER_SITE_OTHER): Payer: PRIVATE HEALTH INSURANCE

## 2018-05-17 DIAGNOSIS — B179 Acute viral hepatitis, unspecified: Secondary | ICD-10-CM

## 2018-05-17 DIAGNOSIS — K7010 Alcoholic hepatitis without ascites: Secondary | ICD-10-CM

## 2018-05-17 LAB — CBC WITH DIFFERENTIAL/PLATELET
Basophils Absolute: 0.2 10*3/uL — ABNORMAL HIGH (ref 0.0–0.1)
Basophils Relative: 1.4 % (ref 0.0–3.0)
EOS ABS: 0.8 10*3/uL — AB (ref 0.0–0.7)
EOS PCT: 5.1 % — AB (ref 0.0–5.0)
HCT: 37.2 % — ABNORMAL LOW (ref 39.0–52.0)
HEMOGLOBIN: 12.8 g/dL — AB (ref 13.0–17.0)
LYMPHS PCT: 17.3 % (ref 12.0–46.0)
Lymphs Abs: 2.8 10*3/uL (ref 0.7–4.0)
MCHC: 34.3 g/dL (ref 30.0–36.0)
MCV: 121.4 fl — ABNORMAL HIGH (ref 78.0–100.0)
MONO ABS: 0.7 10*3/uL (ref 0.1–1.0)
Monocytes Relative: 4.3 % (ref 3.0–12.0)
Neutro Abs: 11.6 10*3/uL — ABNORMAL HIGH (ref 1.4–7.7)
Neutrophils Relative %: 71.9 % (ref 43.0–77.0)
Platelets: 257 10*3/uL (ref 150.0–400.0)
RBC: 3.06 Mil/uL — ABNORMAL LOW (ref 4.22–5.81)
RDW: 14.5 % (ref 11.5–15.5)
WBC: 16.1 10*3/uL — ABNORMAL HIGH (ref 4.0–10.5)

## 2018-05-17 LAB — COMPREHENSIVE METABOLIC PANEL
ALBUMIN: 2.3 g/dL — AB (ref 3.5–5.2)
ALK PHOS: 203 U/L — AB (ref 39–117)
ALT: 58 U/L — ABNORMAL HIGH (ref 0–53)
AST: 133 U/L — AB (ref 0–37)
BUN: 5 mg/dL — AB (ref 6–23)
CALCIUM: 8.4 mg/dL (ref 8.4–10.5)
CO2: 23 mEq/L (ref 19–32)
Chloride: 106 mEq/L (ref 96–112)
Creatinine, Ser: 0.56 mg/dL (ref 0.40–1.50)
GFR: 166.15 mL/min (ref >60.00)
Glucose, Bld: 98 mg/dL (ref 70–99)
POTASSIUM: 3.8 meq/L (ref 3.5–5.1)
Sodium: 139 mEq/L (ref 135–145)
TOTAL PROTEIN: 5.9 g/dL — AB (ref 6.0–8.3)
Total Bilirubin: 10 mg/dL — ABNORMAL HIGH (ref 0.2–1.2)

## 2018-05-17 NOTE — Telephone Encounter (Signed)
Left message on machine to call back  

## 2018-05-17 NOTE — Telephone Encounter (Signed)
Pt's wife calling states pt went and had labs done today and is requesting to speak with the nurse to follow up about medication approval. Best call back # [phone].

## 2018-05-17 NOTE — Telephone Encounter (Signed)
See alternate phone note  

## 2018-05-18 ENCOUNTER — Encounter (HOSPITAL_COMMUNITY): Payer: Self-pay

## 2018-05-18 ENCOUNTER — Emergency Department (HOSPITAL_COMMUNITY)
Admission: EM | Admit: 2018-05-18 | Discharge: 2018-05-18 | Disposition: A | Discharge status: Home / Self Care | Payer: PRIVATE HEALTH INSURANCE | Attending: Emergency Medicine | Admitting: Emergency Medicine

## 2018-05-18 DIAGNOSIS — K7200 Acute and subacute hepatic failure without coma: Secondary | ICD-10-CM | POA: Diagnosis not present

## 2018-05-18 DIAGNOSIS — R600 Localized edema: Secondary | ICD-10-CM | POA: Insufficient documentation

## 2018-05-18 DIAGNOSIS — F17210 Nicotine dependence, cigarettes, uncomplicated: Secondary | ICD-10-CM | POA: Diagnosis not present

## 2018-05-18 DIAGNOSIS — R17 Unspecified jaundice: Secondary | ICD-10-CM

## 2018-05-18 DIAGNOSIS — Z79899 Other long term (current) drug therapy: Secondary | ICD-10-CM | POA: Insufficient documentation

## 2018-05-18 DIAGNOSIS — R410 Disorientation, unspecified: Secondary | ICD-10-CM | POA: Diagnosis present

## 2018-05-18 DIAGNOSIS — I10 Essential (primary) hypertension: Secondary | ICD-10-CM | POA: Insufficient documentation

## 2018-05-18 LAB — COMPREHENSIVE METABOLIC PANEL
ALK PHOS: 199 U/L — AB (ref 38–126)
ALT: 56 U/L — AB (ref 0–44)
ANION GAP: 8 (ref 5–15)
AST: 142 U/L — ABNORMAL HIGH (ref 15–41)
Albumin: 1.7 g/dL — ABNORMAL LOW (ref 3.5–5.0)
BUN: 5 mg/dL — AB (ref 6–20)
CO2: 22 mmol/L (ref 22–32)
Calcium: 8.2 mg/dL — ABNORMAL LOW (ref 8.9–10.3)
Chloride: 107 mmol/L (ref 98–111)
Creatinine, Ser: 0.5 mg/dL — ABNORMAL LOW (ref 0.61–1.24)
GFR calc Af Amer: >60 mL/min (ref >60)
GFR calc non Af Amer: >60 mL/min (ref >60)
GLUCOSE: 108 mg/dL — AB (ref 70–99)
Potassium: 4 mmol/L (ref 3.5–5.1)
Sodium: 137 mmol/L (ref 135–145)
TOTAL PROTEIN: 5.8 g/dL — AB (ref 6.5–8.1)
Total Bilirubin: 8.5 mg/dL — ABNORMAL HIGH (ref 0.3–1.2)

## 2018-05-18 LAB — CBC WITH DIFFERENTIAL/PLATELET
Basophils Absolute: 0.1 10*3/uL (ref 0.0–0.1)
Basophils Relative: 1 %
EOS ABS: 0.5 10*3/uL (ref 0.0–0.7)
Eosinophils Relative: 4 %
HCT: 35.3 % — ABNORMAL LOW (ref 39.0–52.0)
Hemoglobin: 11.7 g/dL — ABNORMAL LOW (ref 13.0–17.0)
Lymphocytes Relative: 16 %
Lymphs Abs: 2.1 10*3/uL (ref 0.7–4.0)
MCH: 40.8 pg — AB (ref 26.0–34.0)
MCHC: 33.1 g/dL (ref 30.0–36.0)
MCV: 123 fL — ABNORMAL HIGH (ref 78.0–100.0)
Monocytes Absolute: 0.8 10*3/uL (ref 0.1–1.0)
Monocytes Relative: 6 %
NEUTROS ABS: 9.6 10*3/uL — AB (ref 1.7–7.7)
NEUTROS PCT: 73 %
PLATELETS: 241 10*3/uL (ref 150–400)
RBC: 2.87 MIL/uL — ABNORMAL LOW (ref 4.22–5.81)
RDW: 13.7 % (ref 11.5–15.5)
WBC: 13.1 10*3/uL — ABNORMAL HIGH (ref 4.0–10.5)

## 2018-05-18 LAB — LIPASE, BLOOD: Lipase: 37 U/L (ref 11–51)

## 2018-05-18 LAB — ACETAMINOPHEN LEVEL: Acetaminophen (Tylenol), Serum: <10 ug/mL — ABNORMAL LOW (ref 10–30)

## 2018-05-18 LAB — URINALYSIS, ROUTINE W REFLEX MICROSCOPIC
Glucose, UA: NEGATIVE mg/dL
HGB URINE DIPSTICK: NEGATIVE
Ketones, ur: NEGATIVE mg/dL
Leukocytes, UA: NEGATIVE
Nitrite: NEGATIVE
PROTEIN: NEGATIVE mg/dL
Specific Gravity, Urine: 1.016 (ref 1.005–1.030)
pH: 5 (ref 5.0–8.0)

## 2018-05-18 LAB — PROTIME-INR
INR: 1.11
Prothrombin Time: 14.2 seconds (ref 11.4–15.2)

## 2018-05-18 LAB — AMMONIA
Ammonia: 105 umol/L — ABNORMAL HIGH (ref <=72)
Ammonia: 37 umol/L — ABNORMAL HIGH (ref 9–35)

## 2018-05-18 LAB — ETHANOL: Alcohol, Ethyl (B): <10 mg/dL (ref <10)

## 2018-05-18 NOTE — ED Notes (Signed)
PA made aware of patient family requesting to speak to her.

## 2018-05-18 NOTE — Discharge Instructions (Addendum)
Today your ammonia was significantly improved from yesterday.  Please keep your follow-up appointment with GI tomorrow.  If you have any concerns, develop fevers, please seek additional medical care and evaluation.

## 2018-05-18 NOTE — ED Notes (Signed)
Pt and family updated on plan of care  

## 2018-05-18 NOTE — ED Provider Notes (Signed)
[facility]-EMERGENCY DEPT Provider Note   CSN: [id number] Arrival date & time: 05/18/18  1508     History   Chief Complaint Chief Complaint  Patient presents with  . Abnormal Lab    HPI Javier Grimes is a 47 y.o. male who presents today for evaluation of abnormal labs.  He was admitted to the hospital from 04/29/2018 until 05/06/2018 for hepatic encephalopathy felt to be secondary to alcohol abuse, acute liver failure without hepatic coma, jaundice, and alcohol withdrawal.  He was discharged home with his wife who assist in providing history today.  She reports that he has been confused for the past few days, stating that he almost burned the house down today.  Patient's wife reports that she called requesting patient get labs done yesterday as his eyes were more yellow.  Labs were obtained yesterday showing that his ammonia was elevated to 105 from 46 at discharge, along with leukocytosis of 16.1 up from 9.9 at discharge and was recommended to come to the emergency room.  Patient reports that he is not having any pain anywhere.  His wife reports that he has been more chilled than usual and concerned that he is more confused.    HPI  Past Medical History:  Diagnosis Date  . Hypertension     Patient Active Problem List   Diagnosis Date Noted  . Acute hepatic encephalopathy 05/02/2018  . Left-sided low back pain without sciatica   . Folate deficiency 05/01/2018  . Acute alcoholic hepatitis 05/01/2018  . Transaminitis 04/30/2018  . Alcohol abuse   . Acute liver failure without hepatic coma   . Ascites   . Hyperbilirubinemia   . Jaundice 04/29/2018  . Acute hepatitis 04/29/2018  . Diarrhea 04/29/2018  . Alcohol withdrawal (HCC) 04/29/2018  . ANXIETY 10/10/2008  . DEPRESSION 10/10/2008    Past Surgical History:  Procedure Laterality Date  . BACK SURGERY          Home Medications    Prior to Admission medications   Medication Sig Start Date End  Date Taking? Authorizing Provider  feeding supplement, ENSURE ENLIVE, (ENSURE ENLIVE) LIQD Take 237 mLs by mouth 3 (three) times daily between meals. 05/06/18  Yes Richarda OverlieAbrol, Nayana, MD  folic acid (FOLVITE) 1 MG tablet Take 1 tablet (1 mg total) by mouth daily. 05/07/18  Yes Richarda OverlieAbrol, Nayana, MD  lactulose (CHRONULAC) 10 GM/15ML solution Take 45 mLs (30 g total) by mouth 2 (two) times daily. 05/06/18  Yes Richarda OverlieAbrol, Nayana, MD  pantoprazole (PROTONIX) 40 MG tablet Take 1 tablet (40 mg total) by mouth daily at 6 (six) AM. 05/07/18  Yes Richarda OverlieAbrol, Nayana, MD  rifaximin (XIFAXAN) 550 MG TABS tablet Take 1 tablet (550 mg total) by mouth 2 (two) times daily. 05/11/18  Yes Unk LightningLemmon, Jennifer Lynne, PA  chlordiazePOXIDE (LIBRIUM) 25 MG capsule Take 1 capsule (25 mg total) by mouth 4 (four) times daily as needed for withdrawal (CIWA score > 10). Patient not taking: Reported on 05/18/2018 05/06/18   Richarda OverlieAbrol, Nayana, MD  hydrOXYzine (ATARAX/VISTARIL) 25 MG tablet Take 1 tablet (25 mg total) by mouth every 6 (six) hours as needed for anxiety (or CIWA score </= 10). Patient not taking: Reported on 05/18/2018 05/06/18   Richarda OverlieAbrol, Nayana, MD  nicotine (NICODERM CQ - DOSED IN MG/24 HOURS) 21 mg/24hr patch Place 1 patch (21 mg total) onto the skin daily. Patient not taking: Reported on 05/18/2018 05/07/18   Richarda OverlieAbrol, Nayana, MD    Family History Family History  Problem  Relation Age of Onset  . CAD Father     Social History Social History   Tobacco Use  . Smoking status: Current Every Day Smoker    Packs/day: 1.00    Types: Cigarettes  . Smokeless tobacco: Current User    Types: Chew  Substance Use Topics  . Alcohol use: Yes  . Drug use: Never     Allergies   Penicillins   Review of Systems Review of Systems  Constitutional: Positive for chills. Negative for fever.  HENT: Negative for ear pain and sore throat.   Eyes: Negative for pain and visual disturbance.  Respiratory: Negative for cough and shortness of breath.     Cardiovascular: Negative for chest pain and palpitations.  Gastrointestinal: Positive for diarrhea (unchanged, taking lactulose). Negative for abdominal pain, nausea and vomiting.  Genitourinary: Negative for dysuria and hematuria.  Musculoskeletal: Negative for arthralgias and back pain.  Skin: Negative for color change and rash.  Neurological: Negative for seizures, syncope and headaches.  Psychiatric/Behavioral: Positive for confusion.  All other systems reviewed and are negative.    Physical Exam Updated Vital Signs BP 120/85   Pulse 87   Temp 99.4 F (37.4 C) (Rectal)   Resp 16   Ht 6' (1.829 m)   Wt 79.5 kg (175 lb 3 oz)   SpO2 98%   BMI 23.76 kg/m   Physical Exam  Constitutional: He appears well-developed and well-nourished. No distress.  HENT:  Head: Normocephalic and atraumatic.  Mouth/Throat: Oropharynx is clear and moist.  Eyes: Scleral icterus is present.  Neck: Neck supple.  Cardiovascular: Normal rate, regular rhythm, normal heart sounds and intact distal pulses.  No murmur heard. 1+ swelling to bilateral lower extremities.   Pulmonary/Chest: Effort normal and breath sounds normal. No respiratory distress.  Abdominal: Soft. Bowel sounds are normal. He exhibits distension. He exhibits no mass. There is no tenderness. There is no guarding.  Musculoskeletal: He exhibits no edema.  Neurological: He is alert.  Skin: Skin is warm and dry. He is not diaphoretic.  Psychiatric: He has a normal mood and affect.  Nursing note and vitals reviewed.    ED Treatments / Results  Labs (all labs ordered are listed, but only abnormal results are displayed) Labs Reviewed  COMPREHENSIVE METABOLIC PANEL - Abnormal; Notable for the following components:      Result Value   Glucose, Bld 108 (*)    BUN 5 (*)    Creatinine, Ser 0.50 (*)    Calcium 8.2 (*)    Total Protein 5.8 (*)    Albumin 1.7 (*)    AST 142 (*)    ALT 56 (*)    Alkaline Phosphatase 199 (*)    Total  Bilirubin 8.5 (*)    All other components within normal limits  CBC WITH DIFFERENTIAL/PLATELET - Abnormal; Notable for the following components:   WBC 13.1 (*)    RBC 2.87 (*)    Hemoglobin 11.7 (*)    HCT 35.3 (*)    MCV 123.0 (*)    MCH 40.8 (*)    Neutro Abs 9.6 (*)    All other components within normal limits  URINALYSIS, ROUTINE W REFLEX MICROSCOPIC - Abnormal; Notable for the following components:   Color, Urine AMBER (*)    Bilirubin Urine MODERATE (*)    All other components within normal limits  ACETAMINOPHEN LEVEL - Abnormal; Notable for the following components:   Acetaminophen (Tylenol), Serum <10 (*)    All other components within  normal limits  AMMONIA - Abnormal; Notable for the following components:   Ammonia 37 (*)    All other components within normal limits  CULTURE, BLOOD (ROUTINE X 2)  CULTURE, BLOOD (ROUTINE X 2)  LIPASE, BLOOD  PROTIME-INR  ETHANOL    EKG None  Radiology No results found.  Procedures Procedures (including critical care time)  Medications Ordered in ED Medications - No data to display   Initial Impression / Assessment and Plan / ED Course  I have reviewed the triage vital signs and the nursing notes.  Pertinent labs & imaging results that were available during my care of the patient were reviewed by me and considered in my medical decision making (see chart for details).  Clinical Course as of May 18 2229  Tue May 18, 2018  1844 Meld score of 16.  Ammonia is down from yesterday, remainder of labs appear his baseline or improved.    [EH]  2004 Patient and his wife that there results appear to be better.  We will touch base with on-call for [facility].  They have an appointment to be seen by [facility] tomorrow.   [EH]  2021 Spoke with GI on call for [facility] who states patient labs appear improving.  He doesn't have anything work up wise or treatment wise to change at this time.     [EH]    Clinical Course User Index [EH]  Cristina Gong, PA-C   Patient presents today for evaluation of reported abnormal labs.  Chart review showed that yesterday he had labs drawn with an ammonia of 105 that was elevated.  Today his ammonia is 37.  Family was concerned that his sclera appear more icteric than normal, however his total bilirubin is down to 8.5, yesterday was 10.0.  Yesterday his white count was 16.1, today it is down to 13.1.  He is afebrile, not tachycardic or tachypneic, does not meet Sirs or sepsis criteria.  His abdomen is not generally tender, not consistent with SBP, especially given the spontaneous decrease in white count since yesterday.  I did speak with on-call for the [facility], Dr. Russella Dar who reviewed patient's labs and records and agrees that patient does not have any apparent condition that would require admission at this time.  He has outpatient GI follow scheduled up tomorrow.  The importance of keeping that appointment was stressed with the patient and his wife who state their understanding.  At the time of discharge return precautions were discussed.  Patient and family denied any unaddressed concerns or complaints today.  Final Clinical Impressions(s) / ED Diagnoses   Final diagnoses:  Jaundice  Acute liver failure without hepatic coma    ED Discharge Orders    None       Norman Clay 05/18/18 2230    Linwood Dibbles, MD 05/20/18 1504

## 2018-05-18 NOTE — Telephone Encounter (Signed)
Notes recorded by Unk LightningLemmon, Jennifer Lynne, PA on 05/17/2018 at 1:32 PM EDT Spoke with Dr. Lavon PaganiniNandigam- given patient's elevated WBC and worsening condition per wife. Would recommend he return to the hospital today. He should go through the ER as he will need infectious workup. -Thanks-JLL ------

## 2018-05-18 NOTE — ED Triage Notes (Signed)
Pt presents with c/o abnormal ammonia levels. Pt was admitted recently and was seen yesterday to have lab work done and was sent here today for abnormal results. Pt also reporting some swelling to his feet and ankles.

## 2018-05-18 NOTE — ED Notes (Signed)
Pt assisted with urinal to void. Pt has generalized weakness and requires stand by assist and help holding urinal

## 2018-05-18 NOTE — Telephone Encounter (Signed)
Pt's wife calling states she is needing to speak about medications states pt is more yellow and ankles are swollen. Best call back # [phone] or [phone].

## 2018-05-18 NOTE — Telephone Encounter (Signed)
The pt wife has been advised and will take the pt to the ED today

## 2018-05-18 NOTE — ED Notes (Signed)
Pt attempting to void at this time.

## 2018-05-19 ENCOUNTER — Ambulatory Visit: Payer: PRIVATE HEALTH INSURANCE | Admitting: Physician Assistant

## 2018-05-19 ENCOUNTER — Other Ambulatory Visit (INDEPENDENT_AMBULATORY_CARE_PROVIDER_SITE_OTHER): Payer: PRIVATE HEALTH INSURANCE

## 2018-05-19 ENCOUNTER — Encounter: Payer: Self-pay | Admitting: Physician Assistant

## 2018-05-19 VITALS — BP 118/60 | HR 82 | Ht 72.0 in | Wt 179.0 lb

## 2018-05-19 DIAGNOSIS — K7010 Alcoholic hepatitis without ascites: Secondary | ICD-10-CM

## 2018-05-19 DIAGNOSIS — K7030 Alcoholic cirrhosis of liver without ascites: Secondary | ICD-10-CM | POA: Diagnosis not present

## 2018-05-19 LAB — CBC WITH DIFFERENTIAL/PLATELET
Basophils Absolute: 0.2 10*3/uL — ABNORMAL HIGH (ref 0.0–0.1)
Basophils Relative: 1.2 % (ref 0.0–3.0)
EOS PCT: 3.8 % (ref 0.0–5.0)
Eosinophils Absolute: 0.5 10*3/uL (ref 0.0–0.7)
HCT: 36.9 % — ABNORMAL LOW (ref 39.0–52.0)
Hemoglobin: 12.5 g/dL — ABNORMAL LOW (ref 13.0–17.0)
Lymphocytes Relative: 14.6 % (ref 12.0–46.0)
Lymphs Abs: 2.1 10*3/uL (ref 0.7–4.0)
MCHC: 33.9 g/dL (ref 30.0–36.0)
MCV: 122.1 fl — ABNORMAL HIGH (ref 78.0–100.0)
Monocytes Absolute: 0.7 10*3/uL (ref 0.1–1.0)
Monocytes Relative: 4.6 % (ref 3.0–12.0)
Neutro Abs: 10.9 10*3/uL — ABNORMAL HIGH (ref 1.4–7.7)
Neutrophils Relative %: 75.8 % (ref 43.0–77.0)
Platelets: 253 10*3/uL (ref 150.0–400.0)
RBC: 3.02 Mil/uL — AB (ref 4.22–5.81)
RDW: 14.8 % (ref 11.5–15.5)
WBC: 14.3 10*3/uL — ABNORMAL HIGH (ref 4.0–10.5)

## 2018-05-19 MED ORDER — RIFAXIMIN 550 MG PO TABS: 550.0000 mg | ORAL_TABLET | Freq: Two times a day (BID) | ORAL | 3 refills | Status: DC

## 2018-05-19 MED ORDER — RIFAXIMIN 550 MG PO TABS: 550.0000 mg | ORAL_TABLET | Freq: Two times a day (BID) | ORAL | 3 refills | Status: AC

## 2018-05-19 NOTE — Progress Notes (Signed)
Chief Complaint: Follow-up alcoholic hepatitis  HPI:    Javier Grimes is a 47 year old male, assigned to Dr. Silverio Decamp at time of recent admission, who returns clinic today for follow-up of Javier alcoholic hepatitis.    Admitted 04/30/2018-05/05/2018 for alcohol abuse and probable acute alcoholic hepatitis with marked cholestasis, MD of 18.  That time LFT pattern was consistent with acute alcoholic hepatitis, discriminant function score less than 32 7 indication for steroids.  Status post diagnostic paracentesis, consistent with portal hypertension.  Patient had elevated ferritin of 1693 and iron saturation of 101 as recommended we exclude hereditary hemochromatosis.  Repeat paracentesis 04/30/2018, 280 mL's removed, no SBP, INR stable at 1.26.  Bili was rising.  At time of discharged lactulose had been reduced to target 3-4 soft but formed stools daily and Javier Grimes is continued on rifaximin 550 mg twice daily.  Liver enzymes and INR slowly trending down which is reassuring in the setting of acute alcoholic hepatitis on top of underlying cirrhosis.  Alcohol cessation was recommended.  HFE gene was negative.    Patient's wife called and explained they could not afford rifaximin, we were working on a prior Auth for this.  Is also instructed to make sure Javier Grimes was using Javier lactulose. 05/17/2018 patient's wife called again describing that her Grimes was just "not right".  CMP, CBC and ammonia were ordered.  CMP showed AST 133 (previously 05/06/2018 130), ALT 58 (previously 49), total bilirubin 10 (previously 16.9), alk phos 203 (previously 228).  Ammonia had increased from 46 to 105.  White count was also elevated with left shift at 16.1.  Patient told to go to the ER.    ER visit 05/18/2018 with AST 142, ALT 56, alk phos 199, bilirubin 8.5.  Ammonia down to 37. CBC showed a white count of 13.1.  At that time Javier Grimes was told Javier Grimes had a meld score of 16, Javier ammonia was down from the day before and the remainder of Javier labs appeared  at baseline or improved.  Apparently they called our on-call physician and were told that Javier Grimes did not need to be admitted.    Today, presents to clinic accompanied by Javier wife who does provide much of Javier history.  Apparently overall Javier Grimes is slowly improving since hospitalization.  Per wife Javier Grimes still seems very confused "Javier Grimes mentions friends we have not seen in years", and this is may be only slightly decreased from prior to hospitalization.  Currently using lactulose 3 times a day with 7-15 bowel movements.  She has placed a diaper on him to do due to some accidents.  They were not able to afford the Xifaxan 550 twice daily.  Javier Grimes remains quite debilitated after recent hospitalization still needing a walker to get around.  Javier Grimes has stopped drinking completely.  Javier wife asked about rehab in the future once Javier Grimes is able to care for himself and be admitted to a rehab facility.  They do report some fluid in Javier feet specifically, but this is no different from prior to hospitalization.    Social history is positive for losing their insurance after today's visit.  Patient did request that I refill Javier psych meds, but told her that I cannot do so.  They are establishing care with [facility]    Denies fever, chills, heartburn, reflux, nausea, vomiting or abdominal pain.  Past Medical History:  Diagnosis Date  . Hypertension  Past Surgical History:  Procedure Laterality Date  . BACK SURGERY      Current Outpatient Medications  Medication Sig Dispense Refill  . chlordiazePOXIDE (LIBRIUM) 25 MG capsule Take 1 capsule (25 mg total) by mouth 4 (four) times daily as needed for withdrawal (CIWA score > 10). (Patient not taking: Reported on 05/18/2018) 30 capsule 0  . feeding supplement, ENSURE ENLIVE, (ENSURE ENLIVE) LIQD Take 237 mLs by mouth 3 (three) times daily between meals. 935 mL 12  . folic acid (FOLVITE) 1 MG tablet Take 1 tablet  (1 mg total) by mouth daily. 30 tablet 1  . hydrOXYzine (ATARAX/VISTARIL) 25 MG tablet Take 1 tablet (25 mg total) by mouth every 6 (six) hours as needed for anxiety (or CIWA score </= 10). (Patient not taking: Reported on 05/18/2018) 30 tablet 0  . lactulose (CHRONULAC) 10 GM/15ML solution Take 45 mLs (30 g total) by mouth 2 (two) times daily. 240 mL 0  . nicotine (NICODERM CQ - DOSED IN MG/24 HOURS) 21 mg/24hr patch Place 1 patch (21 mg total) onto the skin daily. (Patient not taking: Reported on 05/18/2018) 28 patch 0  . pantoprazole (PROTONIX) 40 MG tablet Take 1 tablet (40 mg total) by mouth daily at 6 (six) AM. 30 tablet 1  . rifaximin (XIFAXAN) 550 MG TABS tablet Take 1 tablet (550 mg total) by mouth 2 (two) times daily. 60 tablet 0   No current facility-administered medications for this visit.     Allergies as of 05/19/2018 - Review Complete 05/18/2018  Allergen Reaction Noted  . Penicillins  10/10/2008    Family History  Problem Relation Age of Onset  . CAD Father     Social History   Socioeconomic History  . Marital status: Married    Spouse name: Not on file  . Number of children: Not on file  . Years of education: Not on file  . Highest education level: Not on file  Occupational History  . Not on file  Social Needs  . Financial resource strain: Not on file  . Food insecurity:    Worry: Not on file    Inability: Not on file  . Transportation needs:    Medical: Not on file    Non-medical: Not on file  Tobacco Use  . Smoking status: Current Every Day Smoker    Packs/day: 1.00    Types: Cigarettes  . Smokeless tobacco: Current User    Types: Chew  Substance and Sexual Activity  . Alcohol use: Yes  . Drug use: Never  . Sexual activity: Not on file  Lifestyle  . Physical activity:    Days per week: Not on file    Minutes per session: Not on file  . Stress: Not on file  Relationships  . Social connections:    Talks on phone: Not on file    Gets together: Not  on file    Attends religious service: Not on file    Active member of club or organization: Not on file    Attends meetings of clubs or organizations: Not on file    Relationship status: Not on file  . Intimate partner violence:    Fear of current or ex partner: Not on file    Emotionally abused: Not on file    Physically abused: Not on file    Forced sexual activity: Not on file  Other Topics Concern  . Not on file  Social History Narrative  . Not on file  Review of Systems:    Constitutional: No weight loss, fever or chills Cardiovascular: No chest pain Respiratory: No SOB  Gastrointestinal: See HPI and otherwise negative   Physical Exam:  Vital signs: BP 118/60   Pulse 82   Ht 6' (1.829 m)   Wt 179 lb (81.2 kg)   BMI 24.28 kg/m    Constitutional:   Pleasant ill=appearing, Jaundiced, malnourished Caucasian male appears to be in NAD, Well developed, alert and cooperative Head:  Normocephalic and atraumatic. Eyes:   PEERL, EOMI. No icterus. +scleral icterus Ears:  Normal auditory acuity. Neck:  Supple Throat: Oral cavity and pharynx without inflammation, swelling or lesion.  Respiratory: Respirations even and unlabored. Lungs clear to auscultation bilaterally.   No wheezes, crackles, or rhonchi.  Cardiovascular: Normal S1, S2. No MRG. Regular rate and rhythm. No peripheral edema, cyanosis or pallor.  Gastrointestinal:  Soft, nondistended, nontender. No rebound or guarding. Normal bowel sounds. No appreciable masses or hepatomegaly. Rectal:  Not performed.  Msk:  Symmetrical without gross deformities. +trace edema b/l to top of ankle Neurologic:  Alert and  oriented x4;  grossly normal neurologically.  Skin:   Dry and intact without significant lesions or rashes. Psychiatric: Demonstrates good judgement and reason without abnormal affect or behaviors. no asterixis  RELEVANT LABS AND IMAGING: CBC    Component Value Date/Time   WBC 13.1 (H) 05/18/2018 1657   RBC 2.87  (L) 05/18/2018 1657   HGB 11.7 (L) 05/18/2018 1657   HCT 35.3 (L) 05/18/2018 1657   PLT 241 05/18/2018 1657   MCV 123.0 (H) 05/18/2018 1657   MCH 40.8 (H) 05/18/2018 1657   MCHC 33.1 05/18/2018 1657   RDW 13.7 05/18/2018 1657   LYMPHSABS 2.1 05/18/2018 1657   MONOABS 0.8 05/18/2018 1657   EOSABS 0.5 05/18/2018 1657   BASOSABS 0.1 05/18/2018 1657    CMP     Component Value Date/Time   NA 137 05/18/2018 1657   K 4.0 05/18/2018 1657   CL 107 05/18/2018 1657   CO2 22 05/18/2018 1657   GLUCOSE 108 (H) 05/18/2018 1657   BUN 5 (L) 05/18/2018 1657   CREATININE 0.50 (L) 05/18/2018 1657   CALCIUM 8.2 (L) 05/18/2018 1657   PROT 5.8 (L) 05/18/2018 1657   ALBUMIN 1.7 (L) 05/18/2018 1657   AST 142 (H) 05/18/2018 1657   ALT 56 (H) 05/18/2018 1657   ALKPHOS 199 (H) 05/18/2018 1657   BILITOT 8.5 (H) 05/18/2018 1657   GFRNONAA >60 05/18/2018 1657   GFRAA >60 05/18/2018 1657    Assessment: 1.  Alcoholic hepatitis and alcoholic cirrhosis: Overall patient is improved since recent hospitalization, patient's wife reports some continued baseline confusion, they were unable to afford Xifaxan but Javier Grimes is on lactulose, also some trace fluid in Javier feet, Javier Grimes remains debilitated needing a walker  Plan: 1.  Spent a long time with the patient and Javier wife today educating them about cirrhosis and alcoholic hepatitis.  Answered all their questions. 2.  Provided patient with information regarding [facility] assistance program as they are about to lose their insurance tomorrow.  Discussed that from a GI standpoint patient should hopefully start feeling better in the next 2 to 4 months as long as Javier Grimes continues to abstain from alcohol use. 3.  Provided written prescription for Xifaxan 550 mg twice daily for hepatic encephalopathy.  Did instruct patient to continue Javier lactulose, but can decrease to twice daily as Javier Grimes is currently having 7-15 loose stools per day. 4.  Discussed with  wife that when she feels Javier Grimes is able to  provide for Javier daily care Javier Grimes may then be admitted to rehab 5.  Recommend a less than 2 g sodium diet.  Provided them with information regarding this. 6.  Patient will need an EGD for variceal screening in the future, we can set this up when patient comes in for follow-up appointment as long as they have worked out their insurance difficulties. 7.  Discussed with patient and wife that Javier Grimes should be weighing himself daily at the same time and should record this, if Javier Grimes sees a large increase in the future, Javier Grimes should let us know.  Javier Grimes may need to be started on diuretics. 8.  Patient to follow in clinic in 1 month.  I do not have any appointments available so Javier Grimes will see Tye Savoy, NP who initially consulted him in the hospital. 81.  Told the patient's wife to call us with any concerns in the interim. 17.  Provided patient's wife with Dr. Durenda Age phone number so that she may try and call them today in regards to antianxiety meds.  Is important that they keep this appointment to discuss there are other health concerns and possible home health nursing as well as physical rehab.  Spent over 40 minutes in this office visit on counseling and education.  Ellouise Newer, PA-C [facility] 05/19/2018, 3:23 PM  Cc: No ref. provider found

## 2018-05-19 NOTE — Patient Instructions (Addendum)
Your provider has requested that you go to the basement level for lab work before leaving today. Press "B" on the elevator. The lab is located at the first door on the left as you exit the elevator.  Decrease Lactulose to twice a day.   Call your Primary care doctor to see if he can get a refill of anxiety medications.   Take Xifaxan 550 mg twice a day  Good job not drinking!! You are saving your life, keep up the good work.   Less than 2 grams of sodium diet   Two Gram Sodium Diet 2000 mg  What is Sodium? Sodium is a mineral found naturally in many foods. The most significant source of sodium in the diet is table salt, which is about 40% sodium.  Processed, convenience, and preserved foods also contain a large amount of sodium.  The body needs only 500 mg of sodium daily to function,  A normal diet provides more than enough sodium even if you do not use salt.  Why Limit Sodium? A build up of sodium in the body can cause thirst, increased blood pressure, shortness of breath, and water retention.  Decreasing sodium in the diet can reduce edema and risk of heart attack or stroke associated with high blood pressure.  Keep in mind that there are many other factors involved in these health problems.  Heredity, obesity, lack of exercise, cigarette smoking, stress and what you eat all play a role.  General Guidelines:  Do not add salt at the table or in cooking.  One teaspoon of salt contains over 2 grams of sodium.  Read food labels  Avoid processed and convenience foods  Ask your dietitian before eating any foods not dicussed in the menu planning guidelines  Consult your physician if you wish to use a salt substitute or a sodium containing medication such as antacids.  Limit milk and milk products to 16 oz (2 cups) per day.  Shopping Hints:  READ LABELS!! "Dietetic" does not necessarily mean low sodium.  Salt and other sodium ingredients are often added to foods during  processing.   Menu Planning Guidelines Food Group Choose More Often Avoid  Beverages (see also the milk group All fruit juices, low-sodium, salt-free vegetables juices, low-sodium carbonated beverages Regular vegetable or tomato juices, commercially softened water used for drinking or cooking  Breads and Cereals Enriched white, wheat, rye and pumpernickel bread, hard rolls and dinner rolls; muffins, cornbread and waffles; most dry cereals, cooked cereal without added salt; unsalted crackers and breadsticks; low sodium or homemade bread crumbs Bread, rolls and crackers with salted tops; quick breads; instant hot cereals; pancakes; commercial bread stuffing; self-rising flower and biscuit mixes; regular bread crumbs or cracker crumbs  Desserts and Sweets Desserts and sweets mad with mild should be within allowance Instant pudding mixes and cake mixes  Fats Butter or margarine; vegetable oils; unsalted salad dressings, regular salad dressings limited to 1 Tbs; light, sour and heavy cream Regular salad dressings containing bacon fat, bacon bits, and salt pork; snack dips made with instant soup mixes or processed cheese; salted nuts  Fruits Most fresh, frozen and canned fruits Fruits processed with salt or sodium-containing ingredient (some dried fruits are processed with sodium sulfites        Vegetables Fresh, frozen vegetables and low- sodium canned vegetables Regular canned vegetables, sauerkraut, pickled vegetables, and others prepared in brine; frozen vegetables in sauces; vegetables seasoned with ham, bacon or salt pork  Condiments, Sauces, Miscellaneous  Salt substitute with physician's approval; pepper, herbs, spices; vinegar, lemon or lime juice; hot pepper sauce; garlic powder, onion powder, low sodium soy sauce (1 Tbs.); low sodium condiments (ketchup, chili sauce, mustard) in limited amounts (1 tsp.) fresh ground horseradish; unsalted tortilla chips, pretzels, potato chips, popcorn, salsa  (1/4 cup) Any seasoning made with salt including garlic salt, celery salt, onion salt, and seasoned salt; sea salt, rock salt, kosher salt; meat tenderizers; monosodium glutamate; mustard, regular soy sauce, barbecue, sauce, chili sauce, teriyaki sauce, steak sauce, Worcestershire sauce, and most flavored vinegars; canned gravy and mixes; regular condiments; salted snack foods, olives, picles, relish, horseradish sauce, catsup   Food preparation: Try these seasonings Meats:    Pork Sage, onion Serve with applesauce  Chicken Poultry seasoning, thyme, parsley Serve with cranberry sauce  Lamb Curry powder, rosemary, garlic, thyme Serve with mint sauce or jelly  Veal Marjoram, basil Serve with current jelly, cranberry sauce  Beef Pepper, bay leaf Serve with dry mustard, unsalted chive butter  Fish [place] leaf, dill Serve with unsalted lemon butter, unsalted parsley butter  Vegetables:    Asparagus Lemon juice   Broccoli Lemon juice   Carrots Mustard dressing parsley, mint, nutmeg, glazed with unsalted butter and sugar   Green beans Marjoram, lemon juice, nutmeg,dill seed   Tomatoes Basil, marjoram, onion   Spice /blend for [employer]" 4 tsp ground thyme 1 tsp ground sage 3 tsp ground rosemary 4 tsp ground marjoram   Test your knowledge 1. A product that says "Salt Free" may still contain sodium. True or False 2. Garlic Powder and Hot Pepper Sauce an be used as alternative seasonings.True or False 3. Processed foods have more sodium than fresh foods.  True or False 4. Canned Vegetables have less sodium than froze True or False  WAYS TO DECREASE YOUR SODIUM INTAKE 1. Avoid the use of added salt in cooking and at the table.  Table salt (and other prepared seasonings which contain salt) is probably one of the greatest sources of sodium in the diet.  Unsalted foods can gain flavor from the sweet, sour, and butter taste sensations of herbs and spices.  Instead of using salt for seasoning, try the  following seasonings with the foods listed.  Remember: how you use them to enhance natural food flavors is limited only by your creativity... Allspice-Meat, fish, eggs, fruit, peas, red and yellow vegetables Almond Extract-Fruit baked goods Anise Seed-Sweet breads, fruit, carrots, beets, cottage cheese, cookies (tastes like licorice) Basil-Meat, fish, eggs, vegetables, rice, vegetables salads, soups, sauces Bay Leaf-Meat, fish, stews, poultry Burnet-Salad, vegetables (cucumber-like flavor) Caraway Seed-Bread, cookies, cottage cheese, meat, vegetables, cheese, rice Cardamon-Baked goods, fruit, soups Celery Powder or seed-Salads, salad dressings, sauces, meatloaf, soup, bread.Do not use  celery salt Chervil-Meats, salads, fish, eggs, vegetables, cottage cheese (parsley-like flavor) Chili Power-Meatloaf, chicken cheese, corn, eggplant, egg dishes Chives-Salads cottage cheese, egg dishes, soups, vegetables, sauces Cilantro-Salsa, casseroles Cinnamon-Baked goods, fruit, pork, lamb, chicken, carrots Cloves-Fruit, baked goods, fish, pot roast, green beans, beets, carrots Coriander-Pastry, cookies, meat, salads, cheese (lemon-orange flavor) Cumin-Meatloaf, fish,cheese, eggs, cabbage,fruit pie (caraway flavor) [employer], fruit, eggs, fish, poultry, cottage cheese, vegetables Dill Seed-Meat, cottage cheese, poultry, vegetables, fish, salads, bread Fennel Seed-Bread, cookies, apples, pork, eggs, fish, beets, cabbage, cheese, Licorice-like flavor Garlic-(buds or powder) Salads, meat, poultry, fish, bread, butter, vegetables, potatoes.Do not  use garlic salt Ginger-Fruit, vegetables, baked goods, meat, fish, poultry Horseradish Root-Meet, vegetables, butter Lemon Juice or Extract-Vegetables, fruit, tea, baked goods, fish salads Mace-Baked goods fruit,  vegetables, fish, poultry (taste like nutmeg) Maple Extract-Syrups Marjoram-Meat, chicken, fish, vegetables, breads, green salads (taste like  Sage) Mint-Tea, lamb, sherbet, vegetables, desserts, carrots, cabbage Mustard, Dry or Seed-Cheese, eggs, meats, vegetables, poultry Nutmeg-Baked goods, fruit, chicken, eggs, vegetables, desserts Onion Powder-Meat, fish, poultry, vegetables, cheese, eggs, bread, rice salads (Do not use   Onion salt) Orange Extract-Desserts, baked goods Oregano-Pasta, eggs, cheese, onions, pork, lamb, fish, chicken, vegetables, green salads Paprika-Meat, fish, poultry, eggs, cheese, vegetables Parsley Flakes-Butter, vegetables, meat fish, poultry, eggs, bread, salads (certain forms may   Contain sodium Pepper-Meat fish, poultry, vegetables, eggs Peppermint Extract-Desserts, baked goods Poppy Seed-Eggs, bread, cheese, fruit dressings, baked goods, noodles, vegetables, cottage  [employer], poultry, meat, fish, cauliflower, turnips,eggs bread Saffron-Rice, bread, veal, chicken, fish, eggs Sage-Meat, fish, poultry, onions, eggplant, tomateos, pork, stews Savory-Eggs, salads, poultry, meat, rice, vegetables, soups, pork Tarragon-Meat, poultry, fish, eggs, butter, vegetables (licorice-like flavor)  Thyme-Meat, poultry, fish, eggs, vegetables, (clover-like flavor), sauces, soups Tumeric-Salads, butter, eggs, fish, rice, vegetables (saffron-like flavor) Vanilla Extract-Baked goods, candy Vinegar-Salads, vegetables, meat marinades Walnut Extract-baked goods, candy  2. Choose your Foods Wisely   The following is a list of foods to avoid which are high in sodium:  Meats-Avoid all smoked, canned, salt cured, dried and kosher meat and fish as well as Anchovies   [employer] meats:Bologna, Liverwurst, Pastrami Canned meat or fish  Marinated herring Caviar    Pepperoni Corned Beef   Pizza Dried chipped beef  Salami Frozen breaded fish or meat Salt pork Frankfurters or hot dogs  Sardines Gefilte fish   Sausage Ham (boiled ham, Proscuitto Smoked butt    spiced  ham)   Spam      TV Dinners Vegetables Canned vegetables (Regular) Relish Canned mushrooms  Sauerkraut Olives    Tomato juice Pickles  Bakery and Dessert Products Canned puddings  Cream pies Cheesecake   Decorated cakes Cookies  Beverages/Juices Tomato juice, regular  Gatorade   V-8 vegetable juice, regular  Breads and Cereals Biscuit mixes   Salted potato chips, corn chips, pretzels Bread stuffing mixes  Salted crackers and rolls Pancake and waffle mixes Self-rising flour  Seasonings Accent    Meat sauces Barbecue sauce  Meat tenderizer Catsup    Monosodium glutamate (MSG) Celery salt   Onion salt Chili sauce   Prepared mustard Garlic salt   Salt, seasoned salt, sea salt Gravy mixes   Soy sauce Horseradish   Steak sauce Ketchup   Tartar sauce Lite salt    Teriyaki sauce Marinade mixes   Worcestershire sauce  Others Baking powder   Cocoa and cocoa mixes Baking soda   Commercial casserole mixes Candy-caramels, chocolate  Dehydrated soups    Bars, fudge,nougats  Instant rice and pasta mixes Canned broth or soup  Maraschino cherries Cheese, aged and processed cheese and cheese spreads  Learning Assessment Quiz  Indicated T (for True) or F (for False) for each of the following statements:  1. _____ Fresh fruits and vegetables and unprocessed grains are generally low in sodium 2. _____ Water may contain a considerable amount of sodium, depending on the source 3. _____ You can always tell if a food is high in sodium by tasting it 4. _____ Certain laxatives my be high in sodium and should be avoided unless prescribed   by a physician or pharmacist 5. _____ Salt substitutes may be used freely by anyone on a sodium restricted diet 6. _____ Sodium is present in table salt, food additives  and as a natural component of   most foods 7. _____ Table salt is approximately 90% sodium 8. _____ Limiting sodium intake may help prevent excess fluid accumulation in the  body 9. _____ On a sodium-restricted diet, seasonings such as bouillon soy sauce, and    cooking wine should be used in place of table salt 10. _____ On an ingredient list, a product which lists monosodium glutamate as the first   ingredient is an appropriate food to include on a low sodium diet  Circle the best answer(s) to the following statements (Hint: there may be more than one correct answer)  11. On a low-sodium diet, some acceptable snack items are:    A. Olives  F. Bean dip   K. Grapefruit juice    B. Salted Pretzels G. Commercial Popcorn   L. Canned peaches    C. Carrot Sticks  H. Bouillon   M. Unsalted nuts   D. [place]French fries  I. Peanut butter crackers N. Salami   E. Sweet pickles J. Tomato Juice   O. Pizza  12.  Seasonings that may be used freely on a reduced - sodium diet include   A. Lemon wedges F.Monosodium glutamate K. Celery seed    B.Soysauce   G. Pepper   L. Mustard powder   C. Sea salt  H. Cooking wine  M. Onion flakes   D. Vinegar  E. Prepared horseradish N. Salsa   E. Sage   J. Worcestershire sauce  O. Chutney

## 2018-05-19 NOTE — Progress Notes (Signed)
Reviewed and agree with documentation and assessment and plan. K. Veena Demaria Deeney , MD   

## 2018-05-23 LAB — CULTURE, BLOOD (ROUTINE X 2)
Culture: NO GROWTH
Culture: NO GROWTH
Special Requests: ADEQUATE

## 2018-05-27 ENCOUNTER — Encounter: Payer: Self-pay | Admitting: Internal Medicine

## 2018-05-27 ENCOUNTER — Ambulatory Visit: Payer: Self-pay | Attending: Internal Medicine | Admitting: Internal Medicine

## 2018-05-27 ENCOUNTER — Ambulatory Visit: Payer: PRIVATE HEALTH INSURANCE | Attending: Family Medicine | Admitting: Licensed Clinical Social Worker

## 2018-05-27 VITALS — BP 123/83 | HR 97 | Temp 98.5°F | Resp 16 | Ht 72.0 in | Wt 174.8 lb

## 2018-05-27 DIAGNOSIS — Z79899 Other long term (current) drug therapy: Secondary | ICD-10-CM | POA: Insufficient documentation

## 2018-05-27 DIAGNOSIS — K921 Melena: Secondary | ICD-10-CM | POA: Insufficient documentation

## 2018-05-27 DIAGNOSIS — F332 Major depressive disorder, recurrent severe without psychotic features: Secondary | ICD-10-CM

## 2018-05-27 DIAGNOSIS — F17200 Nicotine dependence, unspecified, uncomplicated: Secondary | ICD-10-CM

## 2018-05-27 DIAGNOSIS — F419 Anxiety disorder, unspecified: Secondary | ICD-10-CM | POA: Insufficient documentation

## 2018-05-27 DIAGNOSIS — M5416 Radiculopathy, lumbar region: Secondary | ICD-10-CM

## 2018-05-27 DIAGNOSIS — K625 Hemorrhage of anus and rectum: Secondary | ICD-10-CM

## 2018-05-27 DIAGNOSIS — F32A Depression, unspecified: Secondary | ICD-10-CM

## 2018-05-27 DIAGNOSIS — Z8249 Family history of ischemic heart disease and other diseases of the circulatory system: Secondary | ICD-10-CM | POA: Insufficient documentation

## 2018-05-27 DIAGNOSIS — F1020 Alcohol dependence, uncomplicated: Secondary | ICD-10-CM | POA: Insufficient documentation

## 2018-05-27 DIAGNOSIS — F17210 Nicotine dependence, cigarettes, uncomplicated: Secondary | ICD-10-CM | POA: Insufficient documentation

## 2018-05-27 DIAGNOSIS — K7010 Alcoholic hepatitis without ascites: Secondary | ICD-10-CM

## 2018-05-27 DIAGNOSIS — Z880 Allergy status to penicillin: Secondary | ICD-10-CM | POA: Insufficient documentation

## 2018-05-27 DIAGNOSIS — F329 Major depressive disorder, single episode, unspecified: Secondary | ICD-10-CM | POA: Insufficient documentation

## 2018-05-27 MED ORDER — LACTULOSE 10 GM/15ML PO SOLN: 20.0000 g | Freq: Two times a day (BID) | ORAL | 2 refills | Status: DC

## 2018-05-27 MED ORDER — PREGABALIN 25 MG PO CAPS: 25.0000 mg | ORAL_CAPSULE | Freq: Two times a day (BID) | ORAL | 1 refills | Status: DC

## 2018-05-27 MED ORDER — RIFAXIMIN 550 MG PO TABS: 550.0000 mg | ORAL_TABLET | Freq: Two times a day (BID) | ORAL | 3 refills | Status: DC

## 2018-05-27 MED ORDER — FOLIC ACID 1 MG PO TABS: 1.0000 mg | ORAL_TABLET | Freq: Every day | ORAL | 1 refills | Status: AC

## 2018-05-27 MED ORDER — HYDROXYZINE HCL 25 MG PO TABS: 25.0000 mg | ORAL_TABLET | Freq: Four times a day (QID) | ORAL | 2 refills | Status: DC | PRN

## 2018-05-27 MED ORDER — ESCITALOPRAM OXALATE 10 MG PO TABS: ORAL_TABLET | ORAL | 2 refills | Status: DC

## 2018-05-27 MED ORDER — PANTOPRAZOLE SODIUM 40 MG PO TBEC: 40.0000 mg | DELAYED_RELEASE_TABLET | Freq: Every day | ORAL | 1 refills | Status: AC

## 2018-05-27 NOTE — Patient Instructions (Signed)
Decrease Lactulose to 20 gram (30 mls) twice a day.  Start Zoloft for depression/anxiety.  Start Low dose

## 2018-05-27 NOTE — BH Specialist Note (Addendum)
Integrated Behavioral Health Initial Visit  MRN: [id number] Name: Javier CityRonald Catarino  Number of Integrated Behavioral Health Clinician visits:: 1/6 Session Start time: 11:15 AM  Session End time: 11:45 AM Total time: 30 minutes  Type of Service: Integrated Behavioral Health- Individual/Family Interpretor:No. Interpretor Name and Language: N/A   Warm Hand Off Completed.       SUBJECTIVE: Javier Grimes is a 47 y.o. male accompanied by Spouse Patient was referred by Dr. Laural BenesJohnson for depression, anxiety, and resources. Patient reports the following symptoms/concerns: feelings of sadness and worry, difficulty sleeping, low energy, feeling bad about self, decreased concentration, irritability, and restlessness Duration of problem: Ongoing, Pt reports diagnosis of bipolar and ptsd; Severity of problem: severe  OBJECTIVE: Mood: Anxious and Irritable and Affect: Appropriate Risk of harm to self or others: No plan to harm self or others  LIFE CONTEXT: Family and Social: Pt receives strong support from spouse. He states extended family reside out of state School/Work: Pt is unemployed, "they eliminated my position" Has an upcoming appt with social security scheduled for 06/07/18 Self-Care: Pt smokes cigarettes (1.5 ppd) He has hx of substance use (alcohol) and is approx a month sober Life Changes: Pt has ongoing medical conditions. He reports financial strain  GOALS ADDRESSED: Patient will: 1. Reduce symptoms of: agitation, anxiety and depression 2. Increase knowledge and/or ability of: coping skills  3. Demonstrate ability to: Increase healthy adjustment to current life circumstances, Increase adequate support systems for patient/family and Decrease self-medicating behaviors  INTERVENTIONS: Interventions utilized: Solution-Focused Strategies, Supportive Counseling, Psychoeducation and/or Health Education and Link to [facility]  Standardized Assessments completed: GAD-7 and PHQ  2&9  ASSESSMENT: Patient currently experiencing depression and anxiety triggered by ongoing medical conditions and financial strain. He reports feelings of sadness and worry, difficulty sleeping, low energy, feeling bad about self, decreased concentration, irritability, and restlessness. Pt receives strong support from spouse. He denies SI/HI/AVH.   Patient may benefit from psychoeducation, psychotherapy, and medication management. LCSWA educated pt on the correlation between physical and mental health, in addition, to how stress can negatively impact both. Therapeutic interventions were discussed to decrease symptoms and promote positive feelings. Pt open to medication management through PCP.  Supportive resources were provided for crisis intervention and psychotherapy. Pt shared barriers to treatment was transportation, SCAT application was provided. Denies any additional SDOH. Family was encouraged to schedule appointment with financial counseling.   PLAN: 1. Follow up with behavioral health clinician on : Pt was encouraged to contact LCSWA if symptoms worsen or fail to improve to [facility] 2. Behavioral recommendations: LCSWA recommends that pt apply healthy coping skills discussed and utilize provided resources. Pt is encouraged to schedule follow up appointment with LCSWA 3. Referral(s): Integrated [facility] Services (In Clinic) and [facility]:  Transportation 4. "From scale of 1-10, how likely are you to follow plan?":   Bridgett LarssonJasmine D Conlin Brahm, LCSW 05/28/18 4:23 PM

## 2018-05-27 NOTE — Progress Notes (Signed)
Pt states the pain starts in his back that's goes down to his feet  Pt states he gets pain on the right side of his stomach

## 2018-05-27 NOTE — Progress Notes (Signed)
Patient ID: Javier Grimes, male    DOB: September 16, 1971  MRN: [id number]  CC: Hospitalization Follow-up   Subjective: Javier Grimes is a 47 y.o. male who presents for new pt visit.  Wife,Rita, is with him and provides a lot of the hx. His concerns today include:   Patient is a 47 year old Caucasian male who presents for new patient visit for hospital follow-up.  Patient admitted 7/12-17/2019 for alcohol abuse and acute alcoholic hepatitis with marked cholestasis with MDF of 18.  Had diagnostic paracentesis consistent with portal hypertension.  Elevated ferritin and iron level noted and hereditary hemochromatosis was ruled out with negative HFE gene.  Had paracentesis that was negative for SBP.  Liver chemistry remained elevated but was slowly trending down by the time of discharge.  Patient was discharged on lactulose and Rifaximin -He has seen gastroenterology PA since discharge.  Dose of lactulose was decreased as he was having 7-15 loose stools per day.  Low-sodium diet recommended.  Continue on Rifaximin.  He has had some problems affording this medication as he no longer has insurance.  Today ETOH hepatitis:  ETOH free 27 days.  Is having 5-7 loose stools a day with current dose of 45 mLs lactulose BID.  Noted some blood in the stools x2 earlier this week.  No abdominal pain.  Reports good appetite.  "Anxiety and depression are all over the place." Reports having issues with this for yrs.  Worse since he quit drinking. -was seeing a psychiatrist a yr ago at [facility] in [place] [facility]. Reports dx of bipolar and PTSD.  Was tried with Lexapro and Seraquil but took only for 2 mths because he was still drinking.  Wife reports he was having bad S.E when he drank and took psychiatric meds -denies any SI but "I do have mood swings now.  I get irritated very quickly.:"  Lower Back Pain:  Had surgery 11/2017 by [facility] due to slip disc on RT lumbar region Since  January, he started having pain  LT lower back that radiates to foot -contributing factors include a backward fall while intoxicated 12/2017. Also he felt back gave out in April when he bend over to get something out of his freezer.  Was Merchandiser, retail for [employer].  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  Smokes a little over 1 pk/day.  Denies street drug use.  Patient Active Problem List   Diagnosis Date Noted  . Acute hepatic encephalopathy 05/02/2018  . Left-sided low back pain without sciatica   . Folate deficiency 05/01/2018  . Acute alcoholic hepatitis 05/01/2018  . Transaminitis 04/30/2018  . Alcohol abuse   . Acute liver failure without hepatic coma   . Ascites   . Hyperbilirubinemia   . Jaundice 04/29/2018  . Acute hepatitis 04/29/2018  . Diarrhea 04/29/2018  . Alcohol withdrawal (HCC) 04/29/2018  . ANXIETY 10/10/2008  . DEPRESSION 10/10/2008     Current Outpatient Medications on File Prior to Visit  Medication Sig Dispense Refill  . chlordiazePOXIDE (LIBRIUM) 25 MG capsule Take 1 capsule (25 mg total) by mouth 4 (four) times daily as needed for withdrawal (CIWA score > 10). 30 capsule 0  . feeding supplement, ENSURE ENLIVE, (ENSURE ENLIVE) LIQD Take 237 mLs by mouth 3 (three) times daily between meals. 237 mL 12  . rifaximin (XIFAXAN) 550 MG TABS tablet Take 1 tablet (550 mg total) by mouth 2 (two) times daily. 180 tablet 3  No current facility-administered medications on file prior to visit.     Allergies  Allergen Reactions  . Penicillins     Has patient had a PCN reaction causing immediate rash, facial/tongue/throat swelling, SOB or lightheadedness with hypotension: Y Has patient had a PCN reaction causing severe rash involving mucus membranes or skin necrosis: Y Has patient had a PCN reaction that required hospitalization: N Has patient had a PCN reaction occurring within the last 10 years: N If all of the above answers are "NO",  then may proceed with Cephalosporin use.     Social History   Socioeconomic History  . Marital status: Married    Spouse name: [place] S[place]  . Number of children: 1  . Years of education: Not on file  . Highest education level: Not on file  Occupational History  . Occupation: unemployed  Social Needs  . Financial resource strain: Not on file  . Food insecurity:    Worry: Not on file    Inability: Not on file  . Transportation needs:    Medical: Not on file    Non-medical: Not on file  Tobacco Use  . Smoking status: Current Every Day Smoker    Packs/day: 1.00    Types: Cigarettes  . Smokeless tobacco: Current User    Types: Chew  . Tobacco comment: tobacco info given 7/31  Substance and Sexual Activity  . Alcohol use: Yes  . Drug use: Never  . Sexual activity: Not on file  Lifestyle  . Physical activity:    Days per week: Not on file    Minutes per session: Not on file  . Stress: Not on file  Relationships  . Social connections:    Talks on phone: Not on file    Gets together: Not on file    Attends religious service: Not on file    Active member of club or organization: Not on file    Attends meetings of clubs or organizations: Not on file    Relationship status: Not on file  . Intimate partner violence:    Fear of current or ex partner: Not on file    Emotionally abused: Not on file    Physically abused: Not on file    Forced sexual activity: Not on file  Other Topics Concern  . Not on file  Social History Narrative  . Not on file    Family History  Problem Relation Age of Onset  . CAD Father   . Heart attack Father   . Colon cancer Neg Hx   . Esophageal cancer Neg Hx   . Rectal cancer Neg Hx     Past Surgical History:  Procedure Laterality Date  . BACK SURGERY    . right foot surgery  1987/88   from an accident, has rods and pins  . TONSILLECTOMY     as a child    ROS: Review of Systems  Constitutional: Negative for activity change and appetite  change.  Gastrointestinal: Negative for abdominal distention and abdominal pain.  Musculoskeletal: Positive for back pain.  Psychiatric/Behavioral: Positive for agitation and sleep disturbance. Negative for suicidal ideas. The patient is nervous/anxious.      PHYSICAL EXAM: BP 123/83   Pulse 97   Temp 98.5 F (36.9 C) (Oral)   Resp 16   Ht 6' (1.829 m)   Wt 174 lb 12.8 oz (79.3 kg)   SpO2 100%   BMI 23.71 kg/m   Physical Exam  General appearance -middle-age Caucasian male  in NAD.  He appears chronically ill and is jaundiced Mental status -he is alert and oriented.  He answers questions appropriately. Eyes: Anicteric sclera Neck - supple, no significant adenopathy Chest - clear to auscultation, no wheezes, rales or rhonchi, symmetric air entry Heart - normal rate, regular rhythm, normal S1, S2, no murmurs, rubs, clicks or gallops Abdomen -soft and nontender.  Liver is enlarged about 4 cm below the right lower rib cage neurological -gross sensation intact in both lower extremities.  Power left lower extremity 4/5 and 5 out of 5 on the right.  Ankle and knee jerk reflexes are brisk bilaterally Positive asterixis Musculoskeletal -gait is slow.  He ambulates with a rolling walker Extremities -no lower extremity edema    Chemistry      Component Value Date/Time   NA 135 05/27/2018 1141   K 4.2 05/27/2018 1141   CL 101 05/27/2018 1141   CO2 20 05/27/2018 1141   BUN 5 (L) 05/27/2018 1141   CREATININE 0.58 (L) 05/27/2018 1141      Component Value Date/Time   CALCIUM 9.0 05/27/2018 1141   ALKPHOS 223 (H) 05/27/2018 1141   AST 188 (H) 05/27/2018 1141   ALT 65 (H) 05/27/2018 1141   BILITOT 5.9 (H) 05/27/2018 1141     Lab Results  Component Value Date   WBC 12.5 (H) 05/27/2018   HGB 13.5 05/27/2018   HCT 39.6 05/27/2018   MCV 116 (H) 05/27/2018   PLT 306 05/27/2018    ASSESSMENT AND PLAN: 1. Alcoholic hepatitis without ascites -Stressed the importance of remaining  alcohol free.  Wife told me that she is suspicious that he has continued to drink in private. -Advised to decrease dose on lactulose to 20 g twice a day to achieve 3 soft to loose stools daily.  Given the reason episode of seeing blood in the stools, we will recheck a CBC - pantoprazole (PROTONIX) 40 MG tablet; Take 1 tablet (40 mg total) by mouth daily at 6 (six) AM.  Dispense: 30 tablet; Refill: 1 - lactulose (CHRONULAC) 10 GM/15ML solution; Take 30 mLs (20 g total) by mouth 2 (two) times daily.  Dispense: 240 mL; Refill: 2 - rifaximin (XIFAXAN) 550 MG TABS tablet; Take 1 tablet (550 mg total) by mouth 2 (two) times daily.  Dispense: 60 tablet; Refill: 3 - folic acid (FOLVITE) 1 MG tablet; Take 1 tablet (1 mg total) by mouth daily.  Dispense: 90 tablet; Refill: 1 - CBC - Comprehensive metabolic panel - Vitamin B12 - Folate  2. Lumbar radiculopathy Once approved for the orange card or cone discount we can refer to neurosurgery.  In the meantime we will get an MRI of the lumbar spine.  We will place him on a low dose of Lyrica.  If it causes any increase drowsiness or confusion his wife will let me know - pregabalin (LYRICA) 25 MG capsule; Take 1 capsule (25 mg total) by mouth 2 (two) times daily.  Dispense: 60 capsule; Refill: 1 - MR Lumbar Spine Wo Contrast; Future  3. Anxiety and depression -Patient is agreeable to be started on medication for anxiety and depression.  No suicidal ideation at this time.  We will put him on low-dose of Lexapro and hydroxyzine. - hydrOXYzine (ATARAX/VISTARIL) 25 MG tablet; Take 1 tablet (25 mg total) by mouth every 6 (six) hours as needed for anxiety (or CIWA score </= 10).  Dispense: 30 tablet; Refill: 2 - escitalopram (LEXAPRO) 10 MG tablet; 1/2 tab PO daily x 2  wk then 1 tab daily  Dispense: 30 tablet; Refill: 2  4. Tobacco dependence Strongly advised smoking cessation  5. Blood per rectum See #1 above.  He will also bring this up with GI on his next  visit with him.  Patient was given the opportunity to ask questions.  Patient verbalized understanding of the plan and was able to repeat key elements of the plan.   Orders Placed This Encounter  Procedures  . MR Lumbar Spine Wo Contrast  . CBC  . Comprehensive metabolic panel  . Vitamin B12  . Folate     Requested Prescriptions   Signed Prescriptions Disp Refills  . hydrOXYzine (ATARAX/VISTARIL) 25 MG tablet 30 tablet 2    Sig: Take 1 tablet (25 mg total) by mouth every 6 (six) hours as needed for anxiety (or CIWA score </= 10).  . pantoprazole (PROTONIX) 40 MG tablet 30 tablet 1    Sig: Take 1 tablet (40 mg total) by mouth daily at 6 (six) AM.  . lactulose (CHRONULAC) 10 GM/15ML solution 240 mL 2    Sig: Take 30 mLs (20 g total) by mouth 2 (two) times daily.  . rifaximin (XIFAXAN) 550 MG TABS tablet 60 tablet 3    Sig: Take 1 tablet (550 mg total) by mouth 2 (two) times daily.  . folic acid (FOLVITE) 1 MG tablet 90 tablet 1    Sig: Take 1 tablet (1 mg total) by mouth daily.  . pregabalin (LYRICA) 25 MG capsule 60 capsule 1    Sig: Take 1 capsule (25 mg total) by mouth 2 (two) times daily.  Marland Kitchen. escitalopram (LEXAPRO) 10 MG tablet 30 tablet 2    Sig: 1/2 tab PO daily x 2 wk then 1 tab daily    Return in about 6 weeks (around 07/08/2018).  Jonah Blueeborah Annaleia Pence, MD, FACP

## 2018-05-28 DIAGNOSIS — F17200 Nicotine dependence, unspecified, uncomplicated: Secondary | ICD-10-CM | POA: Insufficient documentation

## 2018-05-28 DIAGNOSIS — F32A Depression, unspecified: Secondary | ICD-10-CM | POA: Insufficient documentation

## 2018-05-28 DIAGNOSIS — M5416 Radiculopathy, lumbar region: Secondary | ICD-10-CM | POA: Insufficient documentation

## 2018-05-28 DIAGNOSIS — K7010 Alcoholic hepatitis without ascites: Secondary | ICD-10-CM | POA: Insufficient documentation

## 2018-05-28 DIAGNOSIS — F329 Major depressive disorder, single episode, unspecified: Secondary | ICD-10-CM | POA: Insufficient documentation

## 2018-05-28 DIAGNOSIS — F419 Anxiety disorder, unspecified: Secondary | ICD-10-CM | POA: Insufficient documentation

## 2018-05-28 LAB — CBC
HEMOGLOBIN: 13.5 g/dL (ref 13.0–17.7)
Hematocrit: 39.6 % (ref 37.5–51.0)
MCH: 39.5 pg — AB (ref 26.6–33.0)
MCHC: 34.1 g/dL (ref 31.5–35.7)
MCV: 116 fL — ABNORMAL HIGH (ref 79–97)
PLATELETS: 306 10*3/uL (ref 150–450)
RBC: 3.42 x10E6/uL — AB (ref 4.14–5.80)
RDW: 13.8 % (ref 12.3–15.4)
WBC: 12.5 10*3/uL — AB (ref 3.4–10.8)

## 2018-05-28 LAB — COMPREHENSIVE METABOLIC PANEL
ALBUMIN: 3.2 g/dL — AB (ref 3.5–5.5)
ALT: 65 IU/L — AB (ref 0–44)
AST: 188 IU/L — ABNORMAL HIGH (ref 0–40)
Albumin/Globulin Ratio: 0.9 — ABNORMAL LOW (ref 1.2–2.2)
Alkaline Phosphatase: 223 IU/L — ABNORMAL HIGH (ref 39–117)
BUN / CREAT RATIO: 9 (ref 9–20)
BUN: 5 mg/dL — AB (ref 6–24)
Bilirubin Total: 5.9 mg/dL — ABNORMAL HIGH (ref 0.0–1.2)
CALCIUM: 9 mg/dL (ref 8.7–10.2)
CHLORIDE: 101 mmol/L (ref 96–106)
CO2: 20 mmol/L (ref 20–29)
CREATININE: 0.58 mg/dL — AB (ref 0.76–1.27)
GFR calc Af Amer: 140 mL/min/{1.73_m2} (ref >59)
GFR, EST NON AFRICAN AMERICAN: 121 mL/min/{1.73_m2} (ref >59)
GLUCOSE: 100 mg/dL — AB (ref 65–99)
Globulin, Total: 3.4 g/dL (ref 1.5–4.5)
Potassium: 4.2 mmol/L (ref 3.5–5.2)
Sodium: 135 mmol/L (ref 134–144)
TOTAL PROTEIN: 6.6 g/dL (ref 6.0–8.5)

## 2018-05-28 LAB — FOLATE: Folate: >20 ng/mL (ref >3.0)

## 2018-05-28 LAB — VITAMIN B12: Vitamin B-12: 1492 pg/mL — ABNORMAL HIGH (ref 232–1245)

## 2018-06-02 ENCOUNTER — Telehealth: Payer: Self-pay

## 2018-06-02 ENCOUNTER — Ambulatory Visit: Payer: PRIVATE HEALTH INSURANCE | Admitting: Physician Assistant

## 2018-06-02 NOTE — Telephone Encounter (Signed)
Contacted pt to go over lab results pt didn't answer left a detailed vm informing pt of results and if he has any questions or concerns to give me a call  If pt calls back please give results: anemia has improved. Kidney function okay. LFTS still abnormal And slightly worse compared to 9 days before. Folic acid and Vit B 12 levels are good.

## 2018-06-04 ENCOUNTER — Ambulatory Visit (HOSPITAL_COMMUNITY)
Admission: RE | Admit: 2018-06-04 | Discharge: 2018-06-04 | Disposition: A | Discharge status: Home / Self Care | Payer: PRIVATE HEALTH INSURANCE | Source: Ambulatory Visit | Attending: Internal Medicine | Admitting: Internal Medicine

## 2018-06-04 DIAGNOSIS — M5126 Other intervertebral disc displacement, lumbar region: Secondary | ICD-10-CM | POA: Insufficient documentation

## 2018-06-04 DIAGNOSIS — M5416 Radiculopathy, lumbar region: Secondary | ICD-10-CM | POA: Insufficient documentation

## 2018-06-04 DIAGNOSIS — M5127 Other intervertebral disc displacement, lumbosacral region: Secondary | ICD-10-CM | POA: Insufficient documentation

## 2018-06-04 DIAGNOSIS — Z9889 Other specified postprocedural states: Secondary | ICD-10-CM | POA: Insufficient documentation

## 2018-06-06 ENCOUNTER — Other Ambulatory Visit: Payer: Self-pay | Admitting: Internal Medicine

## 2018-06-06 DIAGNOSIS — M5416 Radiculopathy, lumbar region: Secondary | ICD-10-CM

## 2018-06-09 ENCOUNTER — Other Ambulatory Visit (INDEPENDENT_AMBULATORY_CARE_PROVIDER_SITE_OTHER): Payer: PRIVATE HEALTH INSURANCE

## 2018-06-09 ENCOUNTER — Ambulatory Visit (INDEPENDENT_AMBULATORY_CARE_PROVIDER_SITE_OTHER): Payer: PRIVATE HEALTH INSURANCE | Admitting: Nurse Practitioner

## 2018-06-09 ENCOUNTER — Encounter: Payer: Self-pay | Admitting: Nurse Practitioner

## 2018-06-09 ENCOUNTER — Telehealth: Payer: Self-pay

## 2018-06-09 VITALS — BP 118/80 | HR 86 | Ht 72.0 in | Wt 166.4 lb

## 2018-06-09 DIAGNOSIS — R634 Abnormal weight loss: Secondary | ICD-10-CM | POA: Diagnosis not present

## 2018-06-09 DIAGNOSIS — K7010 Alcoholic hepatitis without ascites: Secondary | ICD-10-CM

## 2018-06-09 DIAGNOSIS — R7989 Other specified abnormal findings of blood chemistry: Secondary | ICD-10-CM | POA: Diagnosis not present

## 2018-06-09 LAB — HEPATIC FUNCTION PANEL
ALBUMIN: 3.5 g/dL (ref 3.5–5.2)
ALT: 33 U/L (ref 0–53)
AST: 64 U/L — ABNORMAL HIGH (ref 0–37)
Alkaline Phosphatase: 142 U/L — ABNORMAL HIGH (ref 39–117)
Bilirubin, Direct: 1.6 mg/dL — ABNORMAL HIGH (ref 0.0–0.3)
TOTAL PROTEIN: 7.6 g/dL (ref 6.0–8.3)
Total Bilirubin: 3.8 mg/dL — ABNORMAL HIGH (ref 0.2–1.2)

## 2018-06-09 LAB — FERRITIN: Ferritin: 326.5 ng/mL — ABNORMAL HIGH (ref 22.0–322.0)

## 2018-06-09 NOTE — Progress Notes (Signed)
P Primary GI:  Marsa ArisKavitha Nandigam, MD       Chief Complaint:    Hospital follow up  IMPRESSION and PLAN:    #1. 47 yo male hospitalized 6 days in July with ETOH hepatitis / etoh withdrawal and possible underlying cirrhosis. Imaging suggested only severe steatosis but recannulization of umbilical vein raised concern for possibe early cirrhosis. He was seen for post hospital follow up on 05/19/18 by Hyacinth MeekerJennifer Lemmon, P.A. He was having confusion at home as well as some problems with incontinence, please refer to that dictation for details.  -He was able to get Xifaxan. BMs at goal with lactulose.  -check liver chemistries today.  -EGD for varices screening.   The risks and benefits of EGD were discussed and the patient agrees to proceed.  -ROV up with me 3 to 4 weeks after EGD. -Eventually needs hepatitis A and B vaccination. -he hasn't had any Etoh since hospital discharge  #2. Weight loss. Weight down 20-30 pounds. Appetite is good.  -Increase Ensure to twice daily.  #3. Bilateral foot pain. Pain is tingling, feels like feet are asleep. Taking Lyrica but not helping. He has disc protrusion on recent MRI and has had back pain for 4 months. 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  His B12 is not low, actually elevated.   #4. Anxiety / depression.  PCP started low dose Lexapro and hydroxyzine. He is on 10 mg of Lexaxpro ( max amount due to liver disease). Hopefully meds will help but it not then consider adding / changing to something less reliant on hepatic clearance      HPI:     Patient is a 47 yo male with a history of Etoh abuse hospitalized several days in July with Etoh w/d , Etoh hepatitis with possible underlying cirrhosis. He came for a hospital follow 7/31 with Hyacinth MeekerJennifer Lemmon P.A, please refer to that dictation.   No ETOH since being discharged from hospital. Eating but complains of continued weight loss. No abdominal pain, no N/V. Taking Xifaxan and lactulose BID.  Having 2-4 BMs a day. No blood in stool, no black stool. Following low salt diet. No swelling in abdomen or legs. Still using walker but mainly because of back problems.   Review of systems:     No chest pain, no SOB, no fevers, no urinary sx   Past Medical History:  Diagnosis Date  . Acute alcoholic hepatitis   . Depression   . Hypertension     Patient's surgical history, family medical history, social history, medications and allergies were all reviewed in Epic   Serum creatinine: 0.58 mg/dL (L) 09/81/1907/06/08 [id number] Estimated creatinine clearance: 121.9 mL/min (A)  Current Outpatient Medications  Medication Sig Dispense Refill  . chlordiazePOXIDE (LIBRIUM) 25 MG capsule Take 1 capsule (25 mg total) by mouth 4 (four) times daily as needed for withdrawal (CIWA score > 10). 30 capsule 0  . escitalopram (LEXAPRO) 10 MG tablet 1/2 tab PO daily x 2 wk then 1 tab daily 30 tablet 2  . feeding supplement, ENSURE ENLIVE, (ENSURE ENLIVE) LIQD Take 237 mLs by mouth 3 (three) times daily between meals. 237 mL 12  . folic acid (FOLVITE) 1 MG tablet Take 1 tablet (1 mg total) by mouth daily. 90 tablet 1  . hydrOXYzine (ATARAX/VISTARIL) 25 MG tablet Take 1 tablet (25 mg total) by mouth every 6 (six) hours as needed for anxiety (or CIWA score </= 10). 30 tablet 2  . lactulose (CHRONULAC)  10 GM/15ML solution Take 30 mLs (20 g total) by mouth 2 (two) times daily. 240 mL 2  . pantoprazole (PROTONIX) 40 MG tablet Take 1 tablet (40 mg total) by mouth daily at 6 (six) AM. 30 tablet 1  . pregabalin (LYRICA) 25 MG capsule Take 1 capsule (25 mg total) by mouth 2 (two) times daily. 60 capsule 1  . rifaximin (XIFAXAN) 550 MG TABS tablet Take 1 tablet (550 mg total) by mouth 2 (two) times daily. 180 tablet 3   No current facility-administered medications for this visit.     Physical Exam:     BP 118/80   Pulse 86   Ht 6' (1.829 m)   Wt 166 lb 6.4 oz (75.5 kg)   SpO2 98%   BMI 22.57 kg/m   GENERAL:  Pleasant  male in NAD PSYCH: : Cooperative, normal affect EENT: Mild icterus mucous membranes moist, neck supple without masses CARDIAC:  RRR, no murmur heard, no peripheral edema PULM: Normal respiratory effort, lungs CTA bilaterally, no wheezing ABDOMEN:  Nondistended, soft, nontender. No obvious masses, no hepatomegaly,  normal bowel sounds SKIN:  turgor, no lesions seen Musculoskeletal:  Normal muscle tone, normal strength NEURO: Alert and oriented x 3, no focal neurologic deficits. No asterixis   Willette ClusterPaula Jerusalen Mateja , NP 06/09/2018, 3:05 PM

## 2018-06-09 NOTE — Telephone Encounter (Signed)
Contacted pt to go over MRI results pt is aware of results and doesn't have any questions or concerns

## 2018-06-09 NOTE — Patient Instructions (Signed)
If you are age 47 or older, your body mass index should be between 23-30. Your Body mass index is 22.57 kg/m. If this is out of the aforementioned range listed, please consider follow up with your Primary Care Provider.  If you are age 47 or younger, your body mass index should be between 19-25. Your Body mass index is 22.57 kg/m. If this is out of the aformentioned range listed, please consider follow up with your Primary Care Provider.   You have been scheduled for an endoscopy. Please follow written instructions given to you at your visit today. If you use inhalers (even only as needed), please bring them with you on the day of your procedure. Your physician has requested that you go to www.startemmi.com and enter the access code given to you at your visit today. This web site gives a general overview about your procedure. However, you should still follow specific instructions given to you by our office regarding your preparation for the procedure.  Your provider has requested that you go to the basement level for lab work before leaving today. Press "B" on the elevator. The lab is located at the first door on the left as you exit the elevator.  Follow up with me 3-4 weeks after endoscopy.  Please call the office for an appointment as the schedule is not available at this time.  Thank you for choosing me and [facility].   Willette ClusterPaula Guenther, NP

## 2018-06-10 LAB — IRON AND TIBC
IRON SATURATION: 32 % (ref 15–55)
Iron: 73 ug/dL (ref 38–169)
Total Iron Binding Capacity: 231 ug/dL — ABNORMAL LOW (ref 250–450)
UIBC: 158 ug/dL (ref 111–343)

## 2018-06-13 ENCOUNTER — Encounter: Payer: Self-pay | Admitting: Nurse Practitioner

## 2018-06-14 LAB — HEMOCHROMATOSIS DNA-PCR(C282Y,H63D)

## 2018-06-15 NOTE — Progress Notes (Signed)
Reviewed and agree with documentation and assessment and plan. K. Veena Nandigam , MD   

## 2018-06-30 ENCOUNTER — Ambulatory Visit (AMBULATORY_SURGERY_CENTER): Payer: Self-pay | Admitting: Gastroenterology

## 2018-06-30 ENCOUNTER — Encounter: Payer: Self-pay | Admitting: Gastroenterology

## 2018-06-30 VITALS — BP 102/72 | HR 68 | Temp 99.1°F | Resp 11 | Ht 72.0 in | Wt 166.0 lb

## 2018-06-30 DIAGNOSIS — I8500 Esophageal varices without bleeding: Secondary | ICD-10-CM

## 2018-06-30 DIAGNOSIS — K7030 Alcoholic cirrhosis of liver without ascites: Secondary | ICD-10-CM

## 2018-06-30 MED ORDER — SODIUM CHLORIDE 0.9 % IV SOLN: 500.0000 mL | Freq: Once | INTRAVENOUS | Status: DC

## 2018-06-30 NOTE — Op Note (Signed)
[facility] Patient Name: Javier Grimes Procedure Date: 06/30/2018 10:05 AM MRN: [id number] Endoscopist: Napoleon Form , MD Age: 47 Referring MD:  Date of Birth: Jan 18, 1971 Gender: Male Account #: [id number] Procedure:                Upper GI endoscopy Indications:              To evaluate esophageal varices in patient with                            suspected portal hypertension, Exclusion of                            esophageal varices in patient with suspected portal                            hypertension, Suspected esophageal varices in                            patient with suspected portal hypertension Medicines:                Monitored Anesthesia Care Procedure:                Pre-Anesthesia Assessment:                           - Prior to the procedure, a History and Physical                            was performed, and patient medications and                            allergies were reviewed. The patient's tolerance of                            previous anesthesia was also reviewed. The risks                            and benefits of the procedure and the sedation                            options and risks were discussed with the patient.                            All questions were answered, and informed consent                            was obtained. Prior Anticoagulants: The patient has                            taken no previous anticoagulant or antiplatelet                            agents. ASA Grade Assessment: III - A patient with  severe systemic disease. After reviewing the risks                            and benefits, the patient was deemed in                            satisfactory condition to undergo the procedure.                           After obtaining informed consent, the endoscope was                            passed under direct vision. Throughout the                            procedure, the  patient's blood pressure, pulse, and                            oxygen saturations were monitored continuously. The                            Endoscope was introduced through the mouth, and                            advanced to the second part of duodenum. The upper                            GI endoscopy was accomplished without difficulty.                            The patient tolerated the procedure well. Scope In: Scope Out: Findings:                 The esophagus was normal. No esophageal varices                           Mild portal hypertensive gastropathy was found in                            the entire examined stomach. No gastric varices                           The examined duodenum was normal. Complications:            No immediate complications. Estimated Blood Loss:     Estimated blood loss: none. Impression:               - Normal esophagus.                           - Normal stomach.                           - Normal examined duodenum.                           -  No specimens collected. Recommendation:           - Patient has a contact number available for                            emergencies. The signs and symptoms of potential                            delayed complications were discussed with the                            patient. Return to normal activities tomorrow.                            Written discharge instructions were provided to the                            patient.                           - Resume previous diet.                           - Continue present medications.                           - Repeat upper endoscopy in 2 years for screening                            purposes. Napoleon Form, MD 06/30/2018 10:28:40 AM This report has been signed electronically.

## 2018-06-30 NOTE — Patient Instructions (Signed)
YOU HAD AN ENDOSCOPIC PROCEDURE TODAY AT THE [facility]:   Refer to the procedure report that was given to you for any specific questions about what was found during the examination.  If the procedure report does not answer your questions, please call your gastroenterologist to clarify.  If you requested that your care partner not be given the details of your procedure findings, then the procedure report has been included in a sealed envelope for you to review at your convenience later.  YOU SHOULD EXPECT: Some feelings of bloating in the abdomen. Passage of more gas than usual.  Walking can help get rid of the air that was put into your GI tract during the procedure and reduce the bloating. If you had a lower endoscopy (such as a colonoscopy or flexible sigmoidoscopy) you may notice spotting of blood in your stool or on the toilet paper. If you underwent a bowel prep for your procedure, you may not have a normal bowel movement for a few days.  Please Note:  You might notice some irritation and congestion in your nose or some drainage.  This is from the oxygen used during your procedure.  There is no need for concern and it should clear up in a day or so.  SYMPTOMS TO REPORT IMMEDIATELY:   Following upper endoscopy (EGD)  Vomiting of blood or coffee ground material  New chest pain or pain under the shoulder blades  Painful or persistently difficult swallowing  New shortness of breath  Fever of 100F or higher  Black, tarry-looking stools  For urgent or emergent issues, a gastroenterologist can be reached at any hour by calling [phone].   DIET:  We do recommend a small meal at first, but then you may proceed to your regular diet.  Drink plenty of fluids but you should avoid alcoholic beverages for 24 hours.  ACTIVITY:  You should plan to take it easy for the rest of today and you should NOT DRIVE or use heavy machinery until tomorrow (because of the sedation medicines used  during the test).    FOLLOW UP: Our staff will call the number listed on your records the next business day following your procedure to check on you and address any questions or concerns that you may have regarding the information given to you following your procedure. If we do not reach you, we will leave a message.  However, if you are feeling well and you are not experiencing any problems, there is no need to return our call.  We will assume that you have returned to your regular daily activities without incident.  If any biopsies were taken you will be contacted by phone or by letter within the next 1-3 weeks.  Please call us at [phone] if you have not heard about the biopsies in 3 weeks.    SIGNATURES/CONFIDENTIALITY: You and/or your care partner have signed paperwork which will be entered into your electronic medical record.  These signatures attest to the fact that that the information above on your After Visit Summary has been reviewed and is understood.  Full responsibility of the confidentiality of this discharge information lies with you and/or your care-partner. 

## 2018-06-30 NOTE — Progress Notes (Signed)
A/ox3 pleased with MAC, report to RN 

## 2018-07-01 ENCOUNTER — Telehealth: Payer: Self-pay | Admitting: *Deleted

## 2018-07-01 ENCOUNTER — Telehealth: Payer: Self-pay

## 2018-07-01 NOTE — Telephone Encounter (Signed)
  Follow up Call-  Call back number 06/30/2018  Post procedure Call Back phone  # [phone]  Permission to leave phone message Yes  Some recent data might be hidden     Patient questions:  Message left to call us if necessary.

## 2018-07-01 NOTE — Telephone Encounter (Signed)
  Follow up Call-  Call back number 06/30/2018  Post procedure Call Back phone  # [phone]  Permission to leave phone message Yes  Some recent data might be hidden      No ID on answering machine. Angela/Call-back 2nd attempt

## 2018-07-08 ENCOUNTER — Ambulatory Visit: Payer: Self-pay | Attending: Internal Medicine | Admitting: Internal Medicine

## 2018-07-08 ENCOUNTER — Encounter: Payer: Self-pay | Admitting: Internal Medicine

## 2018-07-08 VITALS — BP 120/80 | HR 87 | Temp 97.9°F | Resp 16 | Wt 172.8 lb

## 2018-07-08 DIAGNOSIS — Z2821 Immunization not carried out because of patient refusal: Secondary | ICD-10-CM

## 2018-07-08 DIAGNOSIS — F419 Anxiety disorder, unspecified: Secondary | ICD-10-CM

## 2018-07-08 DIAGNOSIS — Z79899 Other long term (current) drug therapy: Secondary | ICD-10-CM | POA: Insufficient documentation

## 2018-07-08 DIAGNOSIS — F1011 Alcohol abuse, in remission: Secondary | ICD-10-CM | POA: Insufficient documentation

## 2018-07-08 DIAGNOSIS — F17200 Nicotine dependence, unspecified, uncomplicated: Secondary | ICD-10-CM

## 2018-07-08 DIAGNOSIS — F329 Major depressive disorder, single episode, unspecified: Secondary | ICD-10-CM | POA: Insufficient documentation

## 2018-07-08 DIAGNOSIS — Z8249 Family history of ischemic heart disease and other diseases of the circulatory system: Secondary | ICD-10-CM | POA: Insufficient documentation

## 2018-07-08 DIAGNOSIS — M5416 Radiculopathy, lumbar region: Secondary | ICD-10-CM | POA: Insufficient documentation

## 2018-07-08 DIAGNOSIS — K7010 Alcoholic hepatitis without ascites: Secondary | ICD-10-CM | POA: Insufficient documentation

## 2018-07-08 DIAGNOSIS — F17210 Nicotine dependence, cigarettes, uncomplicated: Secondary | ICD-10-CM | POA: Insufficient documentation

## 2018-07-08 DIAGNOSIS — F4310 Post-traumatic stress disorder, unspecified: Secondary | ICD-10-CM | POA: Insufficient documentation

## 2018-07-08 NOTE — Progress Notes (Signed)
Patient ID: Javier Grimes, male    DOB: 08/26/1971  MRN: [id number]  CC: Follow-up (6 week)   Subjective: Javier Grimes is a 47 y.o. male who presents for chronic disease management.  Wife is with him His concerns today include:  History of bipolar depression and PTSD, tobacco dependence, EtOH abuse in remission, alcoholic hepatitis with ascites, lumbar radiculopathy  ETOH hepatitis: Patient reports that he has been free of alcohol for 61 days.  Saw GI again since our last visit.  Repeat LFTs revealed significant reduction in liver enzymes levels.  EGD revealed mild portal hypertensive gastropathy but was negative for esophageal varices.  He was able to get the rifaximin by ordering it from a pharmaceutical company in [place] online.  Wife reports that it is costing them $180 a month.  She plans to apply for the blue card at our pharmacy where hopefully she would be able to get this at a much reduced price  Lumbar radiculopathy: MRI revealed protruding disc with compression of nerve roots on the left side.  He has an appointment with orthopedics on the second of next month.  Back still hurts, + numbness and tingling in both feet and legs.  Wife was unable to afford the Lyrica.  She plans to turn in the prescription to our pharmacy today to see if she can get it here at the lower cost.  Depression/Anxiety: He has found the Lexapro to be very helpful.    tob dep:  1 pk a day .  Not ready to quit.   HM: He declines flu vaccine.  Patient Active Problem List   Diagnosis Date Noted  . Anxiety and depression 05/28/2018  . Lumbar radiculopathy 05/28/2018  . Alcoholic hepatitis without ascites 05/28/2018  . Tobacco dependence 05/28/2018  . Left-sided low back pain without sciatica   . Folate deficiency 05/01/2018  . Acute alcoholic hepatitis 05/01/2018  . Transaminitis 04/30/2018  . Alcohol abuse   . Hyperbilirubinemia   . Jaundice 04/29/2018     Current Outpatient Medications on  File Prior to Visit  Medication Sig Dispense Refill  . escitalopram (LEXAPRO) 10 MG tablet 1/2 tab PO daily x 2 wk then 1 tab daily 30 tablet 2  . feeding supplement, ENSURE ENLIVE, (ENSURE ENLIVE) LIQD Take 237 mLs by mouth 3 (three) times daily between meals. 237 mL 12  . folic acid (FOLVITE) 1 MG tablet Take 1 tablet (1 mg total) by mouth daily. 90 tablet 1  . hydrOXYzine (ATARAX/VISTARIL) 25 MG tablet Take 1 tablet (25 mg total) by mouth every 6 (six) hours as needed for anxiety (or CIWA score </= 10). 30 tablet 2  . lactulose (CHRONULAC) 10 GM/15ML solution Take 30 mLs (20 g total) by mouth 2 (two) times daily. 240 mL 2  . pantoprazole (PROTONIX) 40 MG tablet Take 1 tablet (40 mg total) by mouth daily at 6 (six) AM. 30 tablet 1  . pregabalin (LYRICA) 25 MG capsule Take 1 capsule (25 mg total) by mouth 2 (two) times daily. (Patient not taking: Reported on 07/08/2018) 60 capsule 1  . rifaximin (XIFAXAN) 550 MG TABS tablet Take 1 tablet (550 mg total) by mouth 2 (two) times daily. 180 tablet 3   No current facility-administered medications on file prior to visit.     Allergies  Allergen Reactions  . Penicillins     Has patient had a PCN reaction causing immediate rash, facial/tongue/throat swelling, SOB or lightheadedness with hypotension: Y Has patient had a PCN  reaction causing severe rash involving mucus membranes or skin necrosis: Y Has patient had a PCN reaction that required hospitalization: N Has patient had a PCN reaction occurring within the last 10 years: N If all of the above answers are "NO", then may proceed with Cephalosporin use.     Social History   Socioeconomic History  . Marital status: Married    Spouse name: [place] Sink  . Number of children: 1  . Years of education: Not on file  . Highest education level: Not on file  Occupational History  . Occupation: unemployed  Social Needs  . Financial resource strain: Not on file  . Food insecurity:    Worry: Not on file     Inability: Not on file  . Transportation needs:    Medical: Not on file    Non-medical: Not on file  Tobacco Use  . Smoking status: Current Every Day Smoker    Packs/day: 1.00    Types: Cigarettes  . Smokeless tobacco: Current User    Types: Chew  . Tobacco comment: tobacco info given 7/31  Substance and Sexual Activity  . Alcohol use: Not Currently    Comment: stopped drinking over 40 days ago  . Drug use: Never  . Sexual activity: Not on file  Lifestyle  . Physical activity:    Days per week: Not on file    Minutes per session: Not on file  . Stress: Not on file  Relationships  . Social connections:    Talks on phone: Not on file    Gets together: Not on file    Attends religious service: Not on file    Active member of club or organization: Not on file    Attends meetings of clubs or organizations: Not on file    Relationship status: Not on file  . Intimate partner violence:    Fear of current or ex partner: Not on file    Emotionally abused: Not on file    Physically abused: Not on file    Forced sexual activity: Not on file  Other Topics Concern  . Not on file  Social History Narrative  . Not on file    Family History  Problem Relation Age of Onset  . CAD Father   . Heart attack Father   . Colon cancer Neg Hx   . Esophageal cancer Neg Hx   . Rectal cancer Neg Hx     Past Surgical History:  Procedure Laterality Date  . BACK SURGERY    . right foot surgery  1987/88   from an accident, has rods and pins  . TONSILLECTOMY     as a child    ROS: Review of Systems Negative except as above. PHYSICAL EXAM: BP 120/80   Pulse 87   Temp 97.9 F (36.6 C) (Oral)   Resp 16   Wt 172 lb 12.8 oz (78.4 kg)   SpO2 100%   BMI 23.44 kg/m   Physical Exam  General appearance - alert, well appearing, and in no distress Mental status - normal mood, behavior, speech, dress, motor activity, and thought processes Chest - clear to auscultation, no wheezes, rales or  rhonchi, symmetric air entry Heart - normal rate, regular rhythm, normal S1, S2, no murmurs, rubs, clicks or gallops Abdomen - soft, nontender, nondistended, no masses or organomegaly Extremities -no lower extremity edema.   ASSESSMENT AND PLAN: 1. Alcoholic hepatitis without ascites Patient clinically improving.  Encourage him to remain free of alcohol  2. Lumbar radiculopathy Prescription for Lyrica sent to our pharmacy.  Patient and wife told to let me know if he has any increase drowsiness or confusion while on the Lyrica.  It is low to 25 mg.  Keep appointment with Ortho next month.  3. Anxiety and depression Doing a lot better on Lexapro.  4. Tobacco dependence Patient advised to quit smoking. Discussed health risks associated with smoking including lung and other types of cancers, chronic lung diseases and CV risks.. Pt not ready to give trail of quitting.    5.  Patient declines flu vaccine Patient was given the opportunity to ask questions.  Patient verbalized understanding of the plan and was able to repeat key elements of the plan.   No orders of the defined types were placed in this encounter.    Requested Prescriptions    No prescriptions requested or ordered in this encounter    Return in about 2 months (around 09/07/2018).  Jonah Blueeborah Johnson, MD, FACP

## 2018-07-16 ENCOUNTER — Ambulatory Visit: Payer: Self-pay | Attending: Internal Medicine

## 2018-07-21 ENCOUNTER — Ambulatory Visit (INDEPENDENT_AMBULATORY_CARE_PROVIDER_SITE_OTHER): Payer: Self-pay | Admitting: Orthopaedic Surgery

## 2018-07-21 ENCOUNTER — Encounter (INDEPENDENT_AMBULATORY_CARE_PROVIDER_SITE_OTHER): Payer: Self-pay | Admitting: Orthopaedic Surgery

## 2018-07-21 VITALS — BP 120/81 | HR 102 | Ht 72.0 in | Wt 175.0 lb

## 2018-07-21 DIAGNOSIS — M5126 Other intervertebral disc displacement, lumbar region: Secondary | ICD-10-CM

## 2018-07-21 NOTE — Progress Notes (Signed)
Office Visit Note   Patient: Javier Grimes           Date of Birth: 1971-03-11           MRN: [id number] Visit Date: 07/21/2018              Requested by: Marcine Matar, MD [address] [place], [place] [address] PCP: Marcine Matar, MD   Assessment & Plan: Visit Diagnoses:  1. 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  Motor exam shows intact resistive testing and he can heel and toe walk.  He has edema of the endplates consistent with the disc undergoing a more rapid degeneration.  Chart review documents incidental durotomy but there is no pseudomeningocele visualized on MRI images which we reviewed with him today.  He already has a copy of the report.  We discussed possible single epidural injection at the L4-5 level on the left.  He does not have significant nerve compression and should get better with time as the rapidly degenerating disc with endplate edema settles down.  He would do best with a job where he can do some sitting some standing moving around not having to do repetitive bending or lifting.  His previous job involved lifting and bending.  We discussed avoiding narcotic medication with his past history of alcohol abuse as well as benzodiazepines.  Walking program is recommended as best exercise.  Follow-Up Instructions: No follow-ups on file.   Orders:  No orders of the defined types were placed in this encounter.  No orders of the defined types were placed in this encounter.     Procedures: No procedures performed   Clinical Data: No additional findings.   Subjective: Chief Complaint  Patient presents with  . Lower Back - Pain    HPI 47 year old male first time visit with history of left L4-5 microdiscectomy done at [facility] in February 2018.  His surgeon he does not recall his name but from  epic review throughCare Everywhere it appears that it was [employer].  Patient states he was having back and right leg pain however his preoperative notes and operative note and RN in the operating room document left as the symptomatic side.  Patient states he thinks his surgeon is no longer there. Patient states he has problems with his legs going up stairs sometimes feels like he will stumble.  He states he has pain in his left leg not in his right.  Pain with bending ,lifting.  Lyrica was too expensive.  He is used Advil without relief. Review of Systems positive for alcohol abuse with alcoholic cirrhosis, benzodiazepine abuse, alcoholic substance abuse tremor, bipolar disorder 1, thrombocytopenia, tobacco abuse.  Incidental durotomy.  Otherwise negative as it pertains HPI.  Alcohol abuse problems after surgery and he is in the hospital July 2019 for jaundice.  Positive for Anxiety and depression.  Objective: Vital Signs: BP 120/81   Pulse (!) 102   Ht 6' (1.829 m)   Wt 175 lb (79.4 kg)   BMI 23.73 kg/m   Physical Exam  Constitutional: He is oriented to person, place, and time. He appears well-developed and well-nourished.  HENT:  Head: Normocephalic and atraumatic.  Eyes: Pupils are equal, round, and reactive to light. EOM are normal.  Neck: No tracheal deviation present. No thyromegaly present.  Cardiovascular: Normal rate.  Pulmonary/Chest: Effort normal. He has no wheezes.  Abdominal: Soft. Bowel sounds are normal.  Neurological: He is alert and oriented to person, place, and time.  Skin: Skin is warm and dry. Capillary refill takes less than 2 seconds.  Psychiatric: He has a normal mood and affect. His behavior is normal. Judgment and thought content normal.    Ortho Exam patient can take several steps with both heel and toe walking.  When he walks he has a slight foot slap gait right and left.  Well-healed lumbar incision slightly oblique across the spinous process at the L4-5  level.  Anterior tib gastrocsoleus take good resistive testing with some giving way weakness similar to quads and adductors.  Improved strength with encouragement.  No pitting edema lower extremities.  Negative logroll to the hips.  Lumbar incision is well-healed without erythema.  Specialty Comments:  No specialty comments available.  Imaging: CLINICAL DATA:  Low back and left leg pain. History of lumbar laminectomy 12/08/2016.  EXAM: MRI LUMBAR SPINE WITHOUT CONTRAST  TECHNIQUE: Multiplanar, multisequence MR imaging of the lumbar spine was performed. No intravenous contrast was administered.  COMPARISON:  MRI 11/07/2016  FINDINGS: Segmentation: There are five lumbar type vertebral bodies. The last full intervertebral disc space is labeled L5-S1. This is consistent with the prior exam.  Alignment:  Normal  Vertebrae: Stable fatty endplate reactive changes at L4-5. No acute bony findings or bone lesions.  Conus medullaris and cauda equina: Conus extends to the bottom of L1 level. Conus and cauda equina appear normal.  Paraspinal and other soft tissues: No significant findings.  Disc levels:  T12-L1: No significant findings.  L1-2: No significant findings.  L2-3: Shallow left foraminal/extraforaminal disc protrusion. No direct neural compression but potential irritation of the left L2 nerve root. This appears relatively stable.  L3-4: Small annular fissure and mild bulging annulus but no significant mass effect on the thecal sac and no foraminal stenosis. Mild facet disease.  L4-5: Postoperative changes from a left-sided laminectomy. There is mass effect on the left side of thecal sac which appears to be compressing the left L5 nerve root in the lateral recess suspicious for a recurrent disc protrusion (particularly on the sagittal images). Epidural fibrosis is felt to be less likely. The exiting L4 nerve root appears normal. No spinal  stenosis.  L5-S1: Shallow central and left paracentral disc protrusion comes close to contacting the left S1 nerve root. This appears stable. No foraminal stenosis.  IMPRESSION: 1. Postoperative changes at L4-5 with findings suspicious for a recurrent left paracentral disc protrusion with mass effect on the left side of the thecal sac and on the left L5 nerve root. 2. Shallow left foraminal/extraforaminal disc protrusion at L2-3. 3. Stable small annular fissure and mild bulging annulus at L3-4. 4. Shallow central and left paracentral disc protrusion at L5-S1.   Electronically Signed   By: Rudie Meyer M.D.   On: 06/04/2018 10:26   PMFS History: Patient Active Problem List   Diagnosis Date Noted  . Anxiety and depression 05/28/2018  . Lumbar radiculopathy 05/28/2018  . Alcoholic hepatitis without ascites 05/28/2018  . Tobacco dependence 05/28/2018  . Folate deficiency 05/01/2018  . Alcohol abuse   . Hyperbilirubinemia   . Jaundice 04/29/2018   Past Medical History:  Diagnosis Date  . Acute alcoholic hepatitis   . Anxiety   . Depression   . Hyperlipidemia   . Hypertension   . Substance abuse (HCC)    ETOH  Family History  Problem Relation Age of Onset  . CAD Father   . Heart attack Father   . Colon cancer Neg Hx   . Esophageal cancer Neg Hx   . Rectal cancer Neg Hx     Past Surgical History:  Procedure Laterality Date  . BACK SURGERY    . right foot surgery  1987/88   from an accident, has rods and pins  . TONSILLECTOMY     as a child   Social History   Occupational History  . Occupation: unemployed  Tobacco Use  . Smoking status: Current Every Day Smoker    Packs/day: 1.00    Types: Cigarettes  . Smokeless tobacco: Current User    Types: Chew  . Tobacco comment: tobacco info given 7/31  Substance and Sexual Activity  . Alcohol use: Not Currently    Comment: stopped drinking over 40 days ago  . Drug use: Never  . Sexual activity: Not on  file

## 2018-07-22 ENCOUNTER — Telehealth: Payer: Self-pay | Admitting: Internal Medicine

## 2018-07-22 NOTE — Telephone Encounter (Signed)
Patient would like an orthopedic specialist who takes patients that don't have insurance. Please follow up.

## 2018-07-26 NOTE — Telephone Encounter (Signed)
CMA attempt to call patient back, no answer, and left Vm.

## 2018-07-28 ENCOUNTER — Ambulatory Visit: Payer: Self-pay | Admitting: Nurse Practitioner

## 2018-08-03 ENCOUNTER — Ambulatory Visit: Payer: Self-pay | Admitting: Nurse Practitioner

## 2018-08-09 ENCOUNTER — Ambulatory Visit: Payer: Self-pay | Admitting: Internal Medicine

## 2018-08-18 ENCOUNTER — Ambulatory Visit: Payer: Self-pay | Admitting: Nurse Practitioner

## 2018-08-20 ENCOUNTER — Telehealth: Payer: Self-pay | Admitting: Nurse Practitioner

## 2018-08-20 ENCOUNTER — Other Ambulatory Visit: Payer: Self-pay

## 2018-08-20 ENCOUNTER — Other Ambulatory Visit: Payer: Self-pay | Admitting: Internal Medicine

## 2018-08-20 DIAGNOSIS — F32A Depression, unspecified: Secondary | ICD-10-CM

## 2018-08-20 DIAGNOSIS — F419 Anxiety disorder, unspecified: Secondary | ICD-10-CM

## 2018-08-20 DIAGNOSIS — M5416 Radiculopathy, lumbar region: Secondary | ICD-10-CM

## 2018-08-20 DIAGNOSIS — F329 Major depressive disorder, single episode, unspecified: Secondary | ICD-10-CM

## 2018-08-20 MED ORDER — PREGABALIN 25 MG PO CAPS: 25.0000 mg | ORAL_CAPSULE | Freq: Two times a day (BID) | ORAL | 1 refills | Status: DC

## 2018-08-20 NOTE — Telephone Encounter (Signed)
Patient wife calling about husbands last visit appt.

## 2018-08-20 NOTE — Telephone Encounter (Signed)
Patient is "off" and "not acting right." He had an appointment 08/18/18. She is inquiring about his labs results. Spouse is on the designated party release. Patient was a no show for the appointment. She is advised if she thinks there are mental alertness changes, she should take him to the ED or an urgent care.

## 2018-08-21 ENCOUNTER — Emergency Department (HOSPITAL_COMMUNITY)
Admission: EM | Admit: 2018-08-21 | Discharge: 2018-08-21 | Disposition: A | Discharge status: Home / Self Care | Payer: Self-pay | Attending: Emergency Medicine | Admitting: Emergency Medicine

## 2018-08-21 ENCOUNTER — Encounter (HOSPITAL_COMMUNITY): Payer: Self-pay | Admitting: Emergency Medicine

## 2018-08-21 ENCOUNTER — Other Ambulatory Visit: Payer: Self-pay

## 2018-08-21 ENCOUNTER — Emergency Department (HOSPITAL_COMMUNITY): Payer: Self-pay

## 2018-08-21 DIAGNOSIS — Z79899 Other long term (current) drug therapy: Secondary | ICD-10-CM | POA: Insufficient documentation

## 2018-08-21 DIAGNOSIS — R410 Disorientation, unspecified: Secondary | ICD-10-CM | POA: Insufficient documentation

## 2018-08-21 DIAGNOSIS — F10230 Alcohol dependence with withdrawal, uncomplicated: Secondary | ICD-10-CM | POA: Insufficient documentation

## 2018-08-21 DIAGNOSIS — I10 Essential (primary) hypertension: Secondary | ICD-10-CM | POA: Insufficient documentation

## 2018-08-21 DIAGNOSIS — K7010 Alcoholic hepatitis without ascites: Secondary | ICD-10-CM | POA: Insufficient documentation

## 2018-08-21 DIAGNOSIS — F17210 Nicotine dependence, cigarettes, uncomplicated: Secondary | ICD-10-CM | POA: Insufficient documentation

## 2018-08-21 LAB — COMPREHENSIVE METABOLIC PANEL
ALT: 27 U/L (ref 0–44)
AST: 50 U/L — AB (ref 15–41)
Albumin: 3.9 g/dL (ref 3.5–5.0)
Alkaline Phosphatase: 105 U/L (ref 38–126)
Anion gap: 12 (ref 5–15)
BILIRUBIN TOTAL: 0.7 mg/dL (ref 0.3–1.2)
BUN: 11 mg/dL (ref 6–20)
CO2: 23 mmol/L (ref 22–32)
CREATININE: 0.55 mg/dL — AB (ref 0.61–1.24)
Calcium: 8.8 mg/dL — ABNORMAL LOW (ref 8.9–10.3)
Chloride: 106 mmol/L (ref 98–111)
GFR calc Af Amer: >60 mL/min (ref >60)
Glucose, Bld: 116 mg/dL — ABNORMAL HIGH (ref 70–99)
POTASSIUM: 3.7 mmol/L (ref 3.5–5.1)
Sodium: 141 mmol/L (ref 135–145)
Total Protein: 7.2 g/dL (ref 6.5–8.1)

## 2018-08-21 LAB — CBC
HCT: 42.9 % (ref 39.0–52.0)
HEMOGLOBIN: 14.2 g/dL (ref 13.0–17.0)
MCH: 32.7 pg (ref 26.0–34.0)
MCHC: 33.1 g/dL (ref 30.0–36.0)
MCV: 98.8 fL (ref 80.0–100.0)
PLATELETS: 173 10*3/uL (ref 150–400)
RBC: 4.34 MIL/uL (ref 4.22–5.81)
RDW: 13.7 % (ref 11.5–15.5)
WBC: 5.9 10*3/uL (ref 4.0–10.5)
nRBC: 0 % (ref 0.0–0.2)

## 2018-08-21 LAB — URINALYSIS, ROUTINE W REFLEX MICROSCOPIC
BILIRUBIN URINE: NEGATIVE
Glucose, UA: NEGATIVE mg/dL
Hgb urine dipstick: NEGATIVE
KETONES UR: NEGATIVE mg/dL
Leukocytes, UA: NEGATIVE
NITRITE: NEGATIVE
Specific Gravity, Urine: >1.03 — ABNORMAL HIGH (ref 1.005–1.030)
pH: 5.5 (ref 5.0–8.0)

## 2018-08-21 LAB — URINALYSIS, MICROSCOPIC (REFLEX)
RBC / HPF: NONE SEEN RBC/hpf (ref 0–5)
Squamous Epithelial / HPF: NONE SEEN (ref 0–5)
WBC, UA: NONE SEEN WBC/hpf (ref 0–5)

## 2018-08-21 LAB — LIPASE, BLOOD: Lipase: 30 U/L (ref 11–51)

## 2018-08-21 LAB — AMMONIA: AMMONIA: 31 umol/L (ref 9–35)

## 2018-08-21 MED ORDER — CHLORDIAZEPOXIDE HCL 25 MG PO CAPS
25.0000 mg | ORAL_CAPSULE | Freq: Once | ORAL | Status: AC
  Administered 2018-08-21: 25 mg via ORAL
  Filled 2018-08-21: qty 1

## 2018-08-21 MED ORDER — CHLORDIAZEPOXIDE HCL 25 MG PO CAPS: ORAL_CAPSULE | ORAL | 0 refills | Status: DC

## 2018-08-21 NOTE — Discharge Instructions (Signed)
You were evaluated in the emergency department for confusion.  Your lab work did not show any increased ammonia and your liver enzymes were not significantly elevated.  It will be important for you to stop drinking and we are providing you with a prescription for some medication to help with your withdrawal symptoms.  We also recommend that you get some support to help with stopping drinking.  Return if any concerns.

## 2018-08-21 NOTE — ED Provider Notes (Signed)
[facility]-EMERGENCY DEPT Provider Note   CSN: [id number] Arrival date & time: 08/21/18  1225     History   Chief Complaint Chief Complaint  Patient presents with  . confusion    HPI Javier Grimes is a 47 y.o. male.  He has brought in with his wife for evaluation of confusion.  He has a history of cirrhosis and has had elevated ammonia before.  He began drinking again recently and has not seen his liver doctor.  His wife is concerned because she is noticed him acting more confused and feel he looks more jaundiced.  The patient himself noticed that he is forgetful and also is complaining of some neuropathy in his legs that he feels was due to alcohol.  He denies any fevers chills nausea vomiting or diarrhea.  He feels like he is in some mild withdrawal right now.  Last drink was last night.  The history is provided by the patient.  Altered Mental Status   This is a recurrent problem. The current episode started more than 1 week ago. The problem has not changed since onset.Associated symptoms include confusion. Pertinent negatives include no seizures, no unresponsiveness, no delusions, no hallucinations, no self-injury and no violence. Risk factors include alcohol intake. His past medical history is significant for liver disease.    Past Medical History:  Diagnosis Date  . Acute alcoholic hepatitis   . Anxiety   . Depression   . Hyperlipidemia   . Hypertension   . Substance abuse (HCC)    ETOH    Patient Active Problem List   Diagnosis Date Noted  . Anxiety and depression 05/28/2018  . Lumbar radiculopathy 05/28/2018  . Alcoholic hepatitis without ascites 05/28/2018  . Tobacco dependence 05/28/2018  . Folate deficiency 05/01/2018  . Alcohol abuse   . Hyperbilirubinemia   . Jaundice 04/29/2018    Past Surgical History:  Procedure Laterality Date  . BACK SURGERY    . right foot surgery  1987/88   from an accident, has rods and pins  .  TONSILLECTOMY     as a child        Home Medications    Prior to Admission medications   Medication Sig Start Date End Date Taking? Authorizing Provider  escitalopram (LEXAPRO) 10 MG tablet TAKE 1/2 (ONE-HALF) TABLET BY MOUTH ONCE DAILY FOR 2 WEEKS AND THEN INCREASE TO 1 TABLET ONCE DAILY 08/20/18   Marcine Matar, MD  feeding supplement, ENSURE ENLIVE, (ENSURE ENLIVE) LIQD Take 237 mLs by mouth 3 (three) times daily between meals. 05/06/18   Richarda Overlie, MD  folic acid (FOLVITE) 1 MG tablet Take 1 tablet (1 mg total) by mouth daily. 05/27/18   Marcine Matar, MD  hydrOXYzine (ATARAX/VISTARIL) 25 MG tablet TAKE 1 TABLET BY MOUTH EVERY 6 HOURS AS NEEDED FOR ANXIETY 08/20/18   Marcine Matar, MD  lactulose (CHRONULAC) 10 GM/15ML solution Take 30 mLs (20 g total) by mouth 2 (two) times daily. 05/27/18   Marcine Matar, MD  pantoprazole (PROTONIX) 40 MG tablet Take 1 tablet (40 mg total) by mouth daily at 6 (six) AM. 05/27/18   Marcine Matar, MD  pregabalin (LYRICA) 25 MG capsule Take 1 capsule (25 mg total) by mouth 2 (two) times daily. 08/20/18   Marcine Matar, MD  rifaximin (XIFAXAN) 550 MG TABS tablet Take 1 tablet (550 mg total) by mouth 2 (two) times daily. 05/19/18   Unk Lightning, PA    Family  History Family History  Problem Relation Age of Onset  . CAD Father   . Heart attack Father   . Colon cancer Neg Hx   . Esophageal cancer Neg Hx   . Rectal cancer Neg Hx     Social History Social History   Tobacco Use  . Smoking status: Current Every Day Smoker    Packs/day: 1.00    Types: Cigarettes  . Smokeless tobacco: Current User    Types: Chew  . Tobacco comment: tobacco info given 7/31  Substance Use Topics  . Alcohol use: Not Currently    Comment: stopped drinking over 40 days ago  . Drug use: Never     Allergies   Penicillins   Review of Systems Review of Systems  Constitutional: Negative for fever.  HENT: Negative for sore throat.     Eyes: Negative for visual disturbance.  Respiratory: Negative for shortness of breath.   Cardiovascular: Negative for chest pain.  Gastrointestinal: Negative for abdominal pain.  Genitourinary: Negative for dysuria.  Musculoskeletal: Positive for back pain.  Skin: Negative for rash.  Neurological: Negative for seizures.  Psychiatric/Behavioral: Positive for confusion. Negative for hallucinations and self-injury.     Physical Exam Updated Vital Signs BP (!) 152/94 (BP Location: Right Arm)   Pulse (!) 109   Temp 97.6 F (36.4 C) (Oral)   Resp 18   SpO2 100%   Physical Exam  Constitutional: He is oriented to person, place, and time. He appears well-developed and well-nourished.  HENT:  Head: Normocephalic and atraumatic.  Eyes: Conjunctivae are normal.  Neck: Neck supple.  Cardiovascular: Regular rhythm. Tachycardia present.  No murmur heard. Pulmonary/Chest: Effort normal and breath sounds normal. No respiratory distress.  Abdominal: Soft. There is no tenderness.  Musculoskeletal: He exhibits no edema.  Neurological: He is alert and oriented to person, place, and time. He has normal strength. He displays tremor. No cranial nerve deficit or sensory deficit. Gait normal. GCS eye subscore is 4. GCS verbal subscore is 5. GCS motor subscore is 6.  Skin: Skin is warm and dry.  Psychiatric: He has a normal mood and affect.  Nursing note and vitals reviewed.    ED Treatments / Results  Labs (all labs ordered are listed, but only abnormal results are displayed) Labs Reviewed  COMPREHENSIVE METABOLIC PANEL - Abnormal; Notable for the following components:      Result Value   Glucose, Bld 116 (*)    Creatinine, Ser 0.55 (*)    Calcium 8.8 (*)    AST 50 (*)    All other components within normal limits  LIPASE, BLOOD  CBC  AMMONIA  URINALYSIS, ROUTINE W REFLEX MICROSCOPIC    EKG None  Radiology Ct Head Wo Contrast  Result Date: 08/21/2018 CLINICAL DATA:  Intermittent  confusion EXAM: CT HEAD WITHOUT CONTRAST TECHNIQUE: Contiguous axial images were obtained from the base of the skull through the vertex without intravenous contrast. COMPARISON:  05/01/2018 FINDINGS: Brain: Generalized atrophic changes are noted. No findings to suggest acute hemorrhage, acute infarction or space-occupying mass lesion are noted. Vascular: No hyperdense vessel or unexpected calcification. Skull: Normal. Negative for fracture or focal lesion. Sinuses/Orbits: Opacification of the right maxillary antrum is noted which is chronic in nature. There is improved aeration of the left maxillary antrum when compared with the prior exam. Other: Stable opacification the right maxillary antrum. Improved aeration on the left is noted. Atrophic changes without acute intracranial abnormality. Electronically Signed   By: Eulah Pont.D.  On: 08/21/2018 16:56    Procedures Procedures (including critical care time)  Medications Ordered in ED Medications  chlordiazePOXIDE (LIBRIUM) capsule 25 mg (has no administration in time range)     Initial Impression / Assessment and Plan / ED Course  I have reviewed the triage vital signs and the nursing notes.  Pertinent labs & imaging results that were available during my care of the patient were reviewed by me and considered in my medical decision making (see chart for details).    Patient here with some early signs of withdrawal. Clear sensorium here and no evidence of worsening liver status by labs. Encouraged abstinence and followup with his doctors.    Final Clinical Impressions(s) / ED Diagnoses   Final diagnoses:  Confusion  Alcohol dependence with uncomplicated withdrawal [facility])    ED Discharge Orders         Ordered    chlordiazePOXIDE (LIBRIUM) 25 MG capsule     08/21/18 1700           Terrilee Files, MD 08/22/18 1254

## 2018-08-21 NOTE — ED Triage Notes (Signed)
Pt c/o intermittent confusion. Pt states he has had hx of elevated ammonia and hx of cirrhosis, was to f/u with MD in September but has not gone and told to come to ED for ammonia levels. Pt states he had small bottle of vodka last night.

## 2018-08-31 ENCOUNTER — Encounter: Payer: Self-pay | Admitting: Internal Medicine

## 2018-08-31 ENCOUNTER — Ambulatory Visit: Payer: Self-pay | Attending: Internal Medicine | Admitting: Internal Medicine

## 2018-08-31 ENCOUNTER — Encounter

## 2018-08-31 VITALS — BP 137/79 | HR 91 | Temp 97.8°F | Resp 16 | Wt 189.6 lb

## 2018-08-31 DIAGNOSIS — F17210 Nicotine dependence, cigarettes, uncomplicated: Secondary | ICD-10-CM | POA: Insufficient documentation

## 2018-08-31 DIAGNOSIS — F1021 Alcohol dependence, in remission: Secondary | ICD-10-CM | POA: Insufficient documentation

## 2018-08-31 DIAGNOSIS — M5416 Radiculopathy, lumbar region: Secondary | ICD-10-CM

## 2018-08-31 DIAGNOSIS — Z880 Allergy status to penicillin: Secondary | ICD-10-CM | POA: Insufficient documentation

## 2018-08-31 DIAGNOSIS — F329 Major depressive disorder, single episode, unspecified: Secondary | ICD-10-CM | POA: Insufficient documentation

## 2018-08-31 DIAGNOSIS — K7011 Alcoholic hepatitis with ascites: Secondary | ICD-10-CM | POA: Insufficient documentation

## 2018-08-31 DIAGNOSIS — F32A Depression, unspecified: Secondary | ICD-10-CM

## 2018-08-31 DIAGNOSIS — F4310 Post-traumatic stress disorder, unspecified: Secondary | ICD-10-CM | POA: Insufficient documentation

## 2018-08-31 DIAGNOSIS — M5116 Intervertebral disc disorders with radiculopathy, lumbar region: Secondary | ICD-10-CM | POA: Insufficient documentation

## 2018-08-31 DIAGNOSIS — Z79899 Other long term (current) drug therapy: Secondary | ICD-10-CM | POA: Insufficient documentation

## 2018-08-31 DIAGNOSIS — Z9889 Other specified postprocedural states: Secondary | ICD-10-CM | POA: Insufficient documentation

## 2018-08-31 MED ORDER — SERTRALINE HCL 50 MG PO TABS: ORAL_TABLET | ORAL | 2 refills | Status: DC

## 2018-08-31 NOTE — Patient Instructions (Addendum)
You are currently on Lexapro 10 mg daily for depression/anxiety.  We discussed stopping this medication.  I advised that you take half a tablet daily for the next 5 days then stop it. Once you have stopped the Lexapro, start the Zoloft as discussed.  Once you finish your current bottle of Lyrica please discontinue taking it.  We will switch you to gabapentin or tramadol.

## 2018-08-31 NOTE — Progress Notes (Signed)
Pt states his lower back pain radiates down to his left lower leg , calf and foot
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
## 2018-09-02 ENCOUNTER — Telehealth: Payer: Self-pay | Admitting: Internal Medicine

## 2018-09-02 NOTE — Telephone Encounter (Signed)
-----   Message from Eldred MangesMark C Yates, MD sent at 09/01/2018  5:54 PM EST ----- He can proceed with ESI lumbar . Dr. Alvester MorinNewton could set this up at our office or you can refer him elsewhere for this. Would be happy to see him in 1 to 2 months if ESI does not help. Thank you. If he wants ESI here with Dr. Alvester MorinNewton he just has to call our office and request and my assistant will send in referral for approval .  ----- Message ----- From: Marcine MatarJohnson, Charman Blasco B, MD Sent: 09/01/2018   9:36 AM EST To: Eldred MangesMark C Yates, MD  Dr. Ophelia CharterYates: Thank you for seeing this patient of mine for lumbar radiculopathy.  I saw him in follow-up yesterday.  He would like to pursue the option of a one-time epidural injection and wanted to know whether he needs to come back and see you to have this set up or whether I need to refer to Dr. Andrey CampanileWilson directly.  Please let me know.  Thanks.

## 2018-09-03 NOTE — Telephone Encounter (Signed)
Contacted pt and made aware of Dr. Johnson message pt doesn't have any questions or concerns  

## 2018-09-07 ENCOUNTER — Ambulatory Visit: Payer: Self-pay | Admitting: Internal Medicine

## 2018-09-13 ENCOUNTER — Telehealth: Payer: Self-pay | Admitting: Licensed Clinical Social Worker

## 2018-09-13 ENCOUNTER — Ambulatory Visit: Payer: Self-pay | Admitting: Internal Medicine

## 2018-09-13 NOTE — Telephone Encounter (Signed)
LCSWA attempted to contact pt to follow up on behavioral health screens from prior appointment. LCSWA left message for a return call.  

## 2018-09-20 ENCOUNTER — Telehealth: Payer: Self-pay | Admitting: Licensed Clinical Social Worker

## 2018-09-20 NOTE — Telephone Encounter (Signed)
Call placed to pt to follow up on behavioral health screens from previos appointment. LCSWA left message for a return call.

## 2018-09-22 ENCOUNTER — Telehealth: Payer: Self-pay | Admitting: Internal Medicine

## 2018-09-22 NOTE — Telephone Encounter (Signed)
Patient called stating his PCP was going to contact sports medicine so that the patient could get some injections. Patient would like to speak with PCP. Please f/u

## 2018-09-28 NOTE — Telephone Encounter (Signed)
Contacted pt and left a detailed vm informing pt that when I last talked to him that he will need to contact [facility] and schedule an appointment with Dr. Alvester Grimes for the injections and I provided number for him to call and if he has any questions or concerns he can give me a call

## 2018-09-29 ENCOUNTER — Telehealth: Payer: Self-pay | Admitting: Internal Medicine

## 2018-09-29 ENCOUNTER — Telehealth (INDEPENDENT_AMBULATORY_CARE_PROVIDER_SITE_OTHER): Payer: Self-pay | Admitting: Physical Medicine and Rehabilitation

## 2018-09-29 NOTE — Telephone Encounter (Signed)
Please contact pt and refer back to the telephone encounter for nov 14. Below is the message that Dr. Laural BenesJohnson stated. I contacted pt and informed him of Dr. Laural BenesJohnson message.    Please call patient and let him know that I heard back from Dr. Ophelia CharterYates. He said that if he wants to have the epidural steroid injection done he can just call Dr. Ophelia CharterYates office and speak with his assistant and request to have an appointment set up with Dr. Alvester MorinNewton for the injection. He can let the assistant know that Dr. Ophelia CharterYates have spoken with me about it.

## 2018-09-29 NOTE — Telephone Encounter (Signed)
Lumbar trans esi at L4 to worst side and f/up with Dr. Ophelia CharterYates or neurosurgeon

## 2018-09-29 NOTE — Telephone Encounter (Signed)
Patient called in regards to the appointment he was to schedule regarding injections with Dr.newton.  Patient states that he was told  that  PCP has to put in a referral in order for him to be seen. Please follow up.

## 2018-09-30 NOTE — Telephone Encounter (Signed)
Scheduled for 10/22/18 at 1530 with driver for left L4 TF.

## 2018-09-30 NOTE — Telephone Encounter (Signed)
Patient was contacted to inform of message, patient says that he has already been contacted and states understanding.  patient had no further questions.

## 2018-10-21 ENCOUNTER — Ambulatory Visit: Payer: Self-pay | Attending: Internal Medicine | Admitting: Internal Medicine

## 2018-10-21 ENCOUNTER — Ambulatory Visit (HOSPITAL_BASED_OUTPATIENT_CLINIC_OR_DEPARTMENT_OTHER): Payer: Self-pay | Admitting: Licensed Clinical Social Worker

## 2018-10-21 ENCOUNTER — Encounter: Payer: Self-pay | Admitting: Internal Medicine

## 2018-10-21 VITALS — BP 125/86 | HR 95 | Temp 98.3°F | Resp 16 | Wt 167.2 lb

## 2018-10-21 DIAGNOSIS — F419 Anxiety disorder, unspecified: Secondary | ICD-10-CM | POA: Insufficient documentation

## 2018-10-21 DIAGNOSIS — M5416 Radiculopathy, lumbar region: Secondary | ICD-10-CM | POA: Insufficient documentation

## 2018-10-21 DIAGNOSIS — K7010 Alcoholic hepatitis without ascites: Secondary | ICD-10-CM | POA: Insufficient documentation

## 2018-10-21 DIAGNOSIS — Z599 Problem related to housing and economic circumstances, unspecified: Secondary | ICD-10-CM

## 2018-10-21 DIAGNOSIS — F17200 Nicotine dependence, unspecified, uncomplicated: Secondary | ICD-10-CM

## 2018-10-21 DIAGNOSIS — F1021 Alcohol dependence, in remission: Secondary | ICD-10-CM | POA: Insufficient documentation

## 2018-10-21 DIAGNOSIS — Z598 Other problems related to housing and economic circumstances: Secondary | ICD-10-CM

## 2018-10-21 DIAGNOSIS — F331 Major depressive disorder, recurrent, moderate: Secondary | ICD-10-CM

## 2018-10-21 DIAGNOSIS — Z79899 Other long term (current) drug therapy: Secondary | ICD-10-CM | POA: Insufficient documentation

## 2018-10-21 DIAGNOSIS — F329 Major depressive disorder, single episode, unspecified: Secondary | ICD-10-CM | POA: Insufficient documentation

## 2018-10-21 DIAGNOSIS — F4310 Post-traumatic stress disorder, unspecified: Secondary | ICD-10-CM | POA: Insufficient documentation

## 2018-10-21 DIAGNOSIS — F17210 Nicotine dependence, cigarettes, uncomplicated: Secondary | ICD-10-CM | POA: Insufficient documentation

## 2018-10-21 MED ORDER — SERTRALINE HCL 100 MG PO TABS: 100.0000 mg | ORAL_TABLET | Freq: Every day | ORAL | 4 refills | Status: AC

## 2018-10-21 MED ORDER — GABAPENTIN 100 MG PO CAPS: 200.0000 mg | ORAL_CAPSULE | Freq: Two times a day (BID) | ORAL | 3 refills | Status: AC

## 2018-10-21 MED ORDER — HYDROXYZINE HCL 25 MG PO TABS: 25.0000 mg | ORAL_TABLET | Freq: Four times a day (QID) | ORAL | 2 refills | Status: DC | PRN

## 2018-10-21 MED ORDER — SERTRALINE HCL 100 MG PO TABS: 100.0000 mg | ORAL_TABLET | Freq: Every day | ORAL | 4 refills | Status: DC

## 2018-10-21 MED ORDER — GABAPENTIN 100 MG PO CAPS: 200.0000 mg | ORAL_CAPSULE | Freq: Two times a day (BID) | ORAL | 3 refills | Status: DC

## 2018-10-21 MED FILL — GABAPENTIN 100 MG CAPSULE: 100 | 30 days supply | Qty: 120 | Fill #0

## 2018-10-21 NOTE — Progress Notes (Signed)
Patient ID: Javier Grimes, male    DOB: April 20, 1971  MRN: [id number]  CC: Follow-up   Subjective: Javier Grimes is a 48 y.o. male who presents for 6 wks f/u depression His concerns today include:  History of bipolar depression and PTSD,tobacco dependence, EtOH abuse in remission,alcoholic hepatitis with ascites, lumbar radiculopathy  Depression: On last visit patient was changed from Lexapro to Zoloft.  He has been taking it consistently.  He feels he needs a higher dose.  "I have trouble sleeping and I do not want to interact with people."  Our LCSW was not able to reach him via phone.  Seen today by LCSW.  Given info about counseling services which he plans to do -has his car back -has had a few relapses with drinking alcohol since last visit.  Lumbar radiculopathy: He was able to schedule ESI with Dr. Ernestina Patches.  This is scheduled for tomorrow.  Never did get Lyrica.  Unable to afford. Working on trying to get Medicaid now that his unemployment has run out  Tob Dep: not ready to quit Patient Active Problem List   Diagnosis Date Noted  . Anxiety and depression 05/28/2018  . Lumbar radiculopathy 05/28/2018  . Alcoholic hepatitis without ascites 05/28/2018  . Tobacco dependence 05/28/2018  . Folate deficiency 05/01/2018  . Alcohol abuse   . Hyperbilirubinemia   . Jaundice 04/29/2018     Current Outpatient Medications on File Prior to Visit  Medication Sig Dispense Refill  . feeding supplement, ENSURE ENLIVE, (ENSURE ENLIVE) LIQD Take 237 mLs by mouth 3 (three) times daily between meals. 268 mL 12  . folic acid (FOLVITE) 1 MG tablet Take 1 tablet (1 mg total) by mouth daily. 90 tablet 1  . hydrOXYzine (ATARAX/VISTARIL) 25 MG tablet TAKE 1 TABLET BY MOUTH EVERY 6 HOURS AS NEEDED FOR ANXIETY 30 tablet 2  . lactulose (CHRONULAC) 10 GM/15ML solution Take 30 mLs (20 g total) by mouth 2 (two) times daily. 240 mL 2  . pantoprazole (PROTONIX) 40 MG tablet Take 1 tablet (40 mg total)  by mouth daily at 6 (six) AM. 30 tablet 1  . pregabalin (LYRICA) 25 MG capsule Take 1 capsule (25 mg total) by mouth 2 (two) times daily. 180 capsule 1  . rifaximin (XIFAXAN) 550 MG TABS tablet Take 1 tablet (550 mg total) by mouth 2 (two) times daily. 180 tablet 3  . sertraline (ZOLOFT) 50 MG tablet 1/2 tab daily PO x 2 weeks then 1 tab daily 30 tablet 2   No current facility-administered medications on file prior to visit.     Allergies  Allergen Reactions  . Penicillins     Has patient had a PCN reaction causing immediate rash, facial/tongue/throat swelling, SOB or lightheadedness with hypotension: Y Has patient had a PCN reaction causing severe rash involving mucus membranes or skin necrosis: Y Has patient had a PCN reaction that required hospitalization: N Has patient had a PCN reaction occurring within the last 10 years: N If all of the above answers are "NO", then may proceed with Cephalosporin use.     Social History   Socioeconomic History  . Marital status: Married    Spouse name: Velva Harman  . Number of children: 1  . Years of education: Not on file  . Highest education level: Not on file  Occupational History  . Occupation: unemployed  Social Needs  . Financial resource strain: Not on file  . Food insecurity:    Worry: Not on file  Inability: Not on file  . Transportation needs:    Medical: Not on file    Non-medical: Not on file  Tobacco Use  . Smoking status: Current Every Day Smoker    Packs/day: 1.00    Types: Cigarettes  . Smokeless tobacco: Current User    Types: Chew  . Tobacco comment: tobacco info given 7/31  Substance and Sexual Activity  . Alcohol use: Not Currently    Comment: stopped drinking over 40 days ago  . Drug use: Never  . Sexual activity: Not on file  Lifestyle  . Physical activity:    Days per week: Not on file    Minutes per session: Not on file  . Stress: Not on file  Relationships  . Social connections:    Talks on phone: Not  on file    Gets together: Not on file    Attends religious service: Not on file    Active member of club or organization: Not on file    Attends meetings of clubs or organizations: Not on file    Relationship status: Not on file  . Intimate partner violence:    Fear of current or ex partner: Not on file    Emotionally abused: Not on file    Physically abused: Not on file    Forced sexual activity: Not on file  Other Topics Concern  . Not on file  Social History Narrative  . Not on file    Family History  Problem Relation Age of Onset  . CAD Father   . Heart attack Father   . Colon cancer Neg Hx   . Esophageal cancer Neg Hx   . Rectal cancer Neg Hx     Past Surgical History:  Procedure Laterality Date  . BACK SURGERY    . right foot surgery  1987/88   from an accident, has rods and pins  . TONSILLECTOMY     as a child    ROS: Review of Systems Negative except as above. PHYSICAL EXAM: BP 125/86   Pulse 95   Temp 98.3 F (36.8 C) (Oral)   Resp 16   Wt 167 lb 3.2 oz (75.8 kg)   SpO2 99%   BMI 22.68 kg/m   Physical Exam General appearance - alert, well appearing, and in no distress Mental status - normal mood, behavior, speech, dress, motor activity, and thought processes  Depression screen [facility] 2/9 10/21/2018 08/31/2018 07/08/2018  Decreased Interest _0 Down, Depressed, Hopeless _1 PHQ - 2 Score _2 Altered sleeping _3 Tired, decreased energy _4 Change in appetite 3 3 0  Feeling bad or failure about yourself  _5 Trouble concentrating _6 Moving slowly or fidgety/restless _7 Suicidal thoughts 0 0 0  PHQ-9 Score _8 ASSESSMENT AND PLAN: 1. Anxiety and depression Increase Zoloft to 100 mg daily.  He met with LCSW today.  He plans to get some counseling. - sertraline (ZOLOFT) 100 MG tablet; Take 1 tablet (100 mg total) by mouth daily.   Dispense: 30 tablet; Refill: 4 - hydrOXYzine (ATARAX/VISTARIL) 25 MG tablet; Take 1  tablet (25 mg total) by mouth every 6 (six) hours as needed. for anxiety  Dispense: 30 tablet; Refill: 2  2. Tobacco dependence Advised to quit.  Discussed health risks associated with smoking.  Patient not ready to give a trial of  quitting.  3. Lumbar radiculopathy Keep appointment with Dr. Ernestina Patches tomorrow.  Lyrica taken off of med list.  We will try him with gabapentin instead - gabapentin (NEURONTIN) 100 MG capsule; Take 2 capsules (200 mg total) by mouth 2 (two) times daily.  Dispense: 120 capsule; Refill: 3  4. Alcohol use disorder, severe, in early remission [facility]) Encouraged him to try to remain free of alcohol.  He has had problem affording the rifaximin.  Will email gastroenterology to see whether he still needs to be on it given that his liver function tests have improved.    Patient was given the opportunity to ask questions.  Patient verbalized understanding of the plan and was able to repeat key elements of the plan.   No orders of the defined types were placed in this encounter.    Requested Prescriptions    No prescriptions requested or ordered in this encounter    No follow-ups on file.  Karle Plumber, MD, FACP

## 2018-10-21 NOTE — Patient Instructions (Addendum)
Please contact Ms. Javier Grimes, Financial Counselor, at [phone] to follow up with Medicaid Pending Status. Medicaid Pending must be taken off file in order to apply for Financial Assistance through [facility] and [facility].   Increase Zoloft to 100 mg daily. I have taken the Lyrica off your medication list since you have not been able to afford this.  Start gabapentin instead.  I will email the gastroenterologist to inquire whether you still need to be on the rifaximin.

## 2018-10-22 ENCOUNTER — Ambulatory Visit (INDEPENDENT_AMBULATORY_CARE_PROVIDER_SITE_OTHER): Payer: Self-pay | Admitting: Physical Medicine and Rehabilitation

## 2018-10-22 ENCOUNTER — Encounter (INDEPENDENT_AMBULATORY_CARE_PROVIDER_SITE_OTHER): Payer: Self-pay | Admitting: Physical Medicine and Rehabilitation

## 2018-10-22 ENCOUNTER — Ambulatory Visit (INDEPENDENT_AMBULATORY_CARE_PROVIDER_SITE_OTHER): Payer: Self-pay

## 2018-10-22 VITALS — BP 118/73 | HR 85 | Temp 97.7°F

## 2018-10-22 DIAGNOSIS — M5416 Radiculopathy, lumbar region: Secondary | ICD-10-CM

## 2018-10-22 MED ORDER — BETAMETHASONE SOD PHOS & ACET 6 (3-3) MG/ML IJ SUSP
12.0000 mg | Freq: Once | INTRAMUSCULAR | Status: AC
  Administered 2018-10-22: 12 mg

## 2018-10-22 NOTE — BH Specialist Note (Signed)
Integrated Behavioral Health Follow Up Visit  MRN: [id number] Name: Javier Grimes  Number of Integrated Behavioral Health Clinician visits: 2/6 Session Start time: 10:30 AM  Session End time: 11:00 AM Total time: 30 minutes  Type of Service: Integrated Behavioral Health- Individual/Family Interpretor:No. Interpretor Name and Language: N/A  SUBJECTIVE: Javier Grimes is a 48 y.o. male accompanied by self Patient was referred by Dr. Laural Benes for depression and anxiety. Patient reports the following symptoms/concerns: feelings of sadness and worry, difficulty sleeping, low energy, feeling bad about self, decreased concentration, irritability, and restlessness Duration of problem: [place]; Severity of problem: severe  OBJECTIVE: Mood: Depressed and Affect: Depressed Risk of harm to self or others: No plan to harm self or others  LIFE CONTEXT: Family and Social: Pt receives limited support. He and spouse are estranged School/Work: Pt is unemployed. He was recently denied [employer] and plans to appeal Self-Care: Pt has hx of substance use (alcohol) He is participating in medication management through PCP and is open to initiating psychotherapy Life Changes: Pt has ongoing medical conditions and is experiencing financial strain with limited support  GOALS ADDRESSED: Patient will: 1.  Reduce symptoms of: anxiety, depression and stress  2.  Increase knowledge and/or ability of: self-management skills  3.  Demonstrate ability to: Increase healthy adjustment to current life circumstances, Increase adequate support systems for patient/family and Increase motivation to adhere to plan of care  INTERVENTIONS: Interventions utilized:  Solution-Focused Strategies, Supportive Counseling and Link to [employer] Standardized Assessments completed: GAD-7 and PHQ 2&9  ASSESSMENT: Patient currently experiencing depression and anxiety triggered by difficulty managing ongoing medical  conditions, conflict in marriage, and financial strain. He reports feelings of sadness and worry, difficulty sleeping, low energy, feeling bad about self, decreased concentration, irritability, and restlessness. He denies SI/HI/AVH.  Patient is participating in medication management through PCP. He is interested in initiating psychotherapy and was provided supportive resources from [facility]. Pt agreed to complete referral to Legal Aid to assist with social security appeal. LCSWA consulted with Financial Counselor to assist pt with financial application. Pt was provided contact information for Ms. Ana Leamon [phone] to address medicaid pending status. Pt verbalized understanding.  PLAN: 1. Follow up with behavioral health clinician on : Pt was encouraged tocontact LCSWA if symptoms worsen or fail to improveto schedule behavioral appointments at [facility]. 2. Behavioral recommendations: LCSWA recommends that pt apply healthy coping skills discussed and utilize provided resources. Pt is encouraged to schedule follow up appointment with LCSWA 3. Referral(s): Integrated Art gallery manager (In Clinic), Community Mental Health Services (LME/Outside Clinic) and Community Resources:  Legal Aid 4. "From scale of 1-10, how likely are you to follow plan?":   Bridgett Larsson, LCSW 10/22/18

## 2018-10-22 NOTE — Patient Instructions (Signed)

## 2018-10-22 NOTE — Progress Notes (Signed)
Numeric Pain Rating Scale and Functional Assessment Average Pain 10   In the last MONTH (on 0-10 scale) has pain interfered with the following?  1. General activity like being  able to carry out your everyday physical activities such as walking, climbing stairs, carrying groceries, or moving a chair?  Rating(10)   +Driver, -BT, -Dye Allergies. 

## 2018-10-28 NOTE — Procedures (Signed)
Lumbosacral Transforaminal Epidural Steroid Injection - Sub-Pedicular Approach with Fluoroscopic Guidance  Patient: Javier CityRonald Cortese      Date of Birth: Dec 28, 1970 MRN: [id number] PCP: Marcine MatarJohnson, Deborah B, MD      Visit Date: 10/22/2018   Universal Protocol:    Date/Time: 10/22/2018  Consent Given By: the patient  Position: PRONE  Additional Comments: Vital signs were monitored before and after the procedure. Patient was prepped and draped in the usual sterile fashion. The correct patient, procedure, and site was verified.   Injection Procedure Details:  Procedure Site One Meds Administered:  Meds ordered this encounter  Medications  . betamethasone acetate-betamethasone sodium phosphate (CELESTONE) injection 12 mg    Laterality: Left  Location/Site:  L5-S1  Needle size: 22 G  Needle type: Spinal  Needle Placement: Transforaminal  Findings:    -Comments: Excellent flow of contrast along the nerve and into the epidural space.  Procedure Details: After squaring off the end-plates to get a true AP view, the C-arm was positioned so that an oblique view of the foramen as noted above was visualized. The target area is just inferior to the "nose of the scotty dog" or sub pedicular. The soft tissues overlying this structure were infiltrated with 2-3 ml. of 1% Lidocaine without Epinephrine.  The spinal needle was inserted toward the target using a "trajectory" view along the fluoroscope beam.  Under AP and lateral visualization, the needle was advanced so it did not puncture dura and was located close the 6 O'Clock position of the pedical in AP tracterory. Biplanar projections were used to confirm position. Aspiration was confirmed to be negative for CSF and/or blood. A 1-2 ml. volume of Isovue-250 was injected and flow of contrast was noted at each level. Radiographs were obtained for documentation purposes.   After attaining the desired flow of contrast documented above, a 0.5  to 1.0 ml test dose of 0.25% Marcaine was injected into each respective transforaminal space.  The patient was observed for 90 seconds post injection.  After no sensory deficits were reported, and normal lower extremity motor function was noted,   the above injectate was administered so that equal amounts of the injectate were placed at each foramen (level) into the transforaminal epidural space.   Additional Comments:  The patient tolerated the procedure well Dressing: Band-Aid    Post-procedure details: Patient was observed during the procedure. Post-procedure instructions were reviewed.  Patient left the clinic in stable condition.

## 2018-10-28 NOTE — Progress Notes (Signed)
Javier CityRonald Mort - 48 y.o. male MRN [id number]  Date of birth: Mar 02, 1971  Office Visit Note: Visit Date: 10/22/2018 PCP: Marcine MatarJohnson, Deborah B, MD Referred by: Marcine MatarJohnson, Deborah B, MD  Subjective: No chief complaint on file.  HPI:  Javier Grimes is a 48 y.o. male who comes in today At the request of Dr. Annell GreeningMark Yates for left lumbar epidural injection at L4-5 region.  He has had prior lumbar discectomy at this region there is MRI evidence of mass-effect at the L4-5 region likely affecting the L5 nerve root in the lateral recess this is concordant with the symptoms.  This is been ongoing now for several months without relief with medical treatment.  He is on gabapentin.  We will complete a diagnostic and hopefully therapeutic left L5 transforaminal epidural steroid injection.  ROS Otherwise per HPI.  Assessment & Plan: Visit Diagnoses:  1. Lumbar radiculopathy     Plan: No additional findings.   Meds & Orders:  Meds ordered this encounter  Medications  . betamethasone acetate-betamethasone sodium phosphate (CELESTONE) injection 12 mg    Orders Placed This Encounter  Procedures  . XR C-ARM NO REPORT  . Epidural Steroid injection    Follow-up: Return if symptoms worsen or fail to improve, for Annell GreeningMark Yates, MD.   Procedures: No procedures performed  Lumbosacral Transforaminal Epidural Steroid Injection - Sub-Pedicular Approach with Fluoroscopic Guidance  Patient: Javier CityRonald Felmlee      Date of Birth: Mar 02, 1971 MRN: [id number] PCP: Marcine MatarJohnson, Deborah B, MD      Visit Date: 10/22/2018   Universal Protocol:    Date/Time: 10/22/2018  Consent Given By: the patient  Position: PRONE  Additional Comments: Vital signs were monitored before and after the procedure. Patient was prepped and draped in the usual sterile fashion. The correct patient, procedure, and site was verified.   Injection Procedure Details:  Procedure Site One Meds Administered:  Meds ordered this encounter    Medications  . betamethasone acetate-betamethasone sodium phosphate (CELESTONE) injection 12 mg    Laterality: Left  Location/Site:  L5-S1  Needle size: 22 G  Needle type: Spinal  Needle Placement: Transforaminal  Findings:    -Comments: Excellent flow of contrast along the nerve and into the epidural space.  Procedure Details: After squaring off the end-plates to get a true AP view, the C-arm was positioned so that an oblique view of the foramen as noted above was visualized. The target area is just inferior to the "nose of the scotty dog" or sub pedicular. The soft tissues overlying this structure were infiltrated with 2-3 ml. of 1% Lidocaine without Epinephrine.  The spinal needle was inserted toward the target using a "trajectory" view along the fluoroscope beam.  Under AP and lateral visualization, the needle was advanced so it did not puncture dura and was located close the 6 O'Clock position of the pedical in AP tracterory. Biplanar projections were used to confirm position. Aspiration was confirmed to be negative for CSF and/or blood. A 1-2 ml. volume of Isovue-250 was injected and flow of contrast was noted at each level. Radiographs were obtained for documentation purposes.   After attaining the desired flow of contrast documented above, a 0.5 to 1.0 ml test dose of 0.25% Marcaine was injected into each respective transforaminal space.  The patient was observed for 90 seconds post injection.  After no sensory deficits were reported, and normal lower extremity motor function was noted,   the above injectate was administered so that equal amounts of the injectate  were placed at each foramen (level) into the transforaminal epidural space.   Additional Comments:  The patient tolerated the procedure well Dressing: Band-Aid    Post-procedure details: Patient was observed during the procedure. Post-procedure instructions were reviewed.  Patient left the clinic in stable  condition.     Clinical History: MRI LUMBAR SPINE WITHOUT CONTRAST  TECHNIQUE: Multiplanar, multisequence MR imaging of the lumbar spine was performed. No intravenous contrast was administered.  COMPARISON:  MRI 11/07/2016  FINDINGS: Segmentation: There are five lumbar type vertebral bodies. The last full intervertebral disc space is labeled L5-S1. This is consistent with the prior exam.  Alignment:  Normal  Vertebrae: Stable fatty endplate reactive changes at L4-5. No acute bony findings or bone lesions.  Conus medullaris and cauda equina: Conus extends to the bottom of L1 level. Conus and cauda equina appear normal.  Paraspinal and other soft tissues: No significant findings.  Disc levels:  T12-L1: No significant findings.  L1-2: No significant findings.  L2-3: Shallow left foraminal/extraforaminal disc protrusion. No direct neural compression but potential irritation of the left L2 nerve root. This appears relatively stable.  L3-4: Small annular fissure and mild bulging annulus but no significant mass effect on the thecal sac and no foraminal stenosis. Mild facet disease.  L4-5: Postoperative changes from a left-sided laminectomy. There is mass effect on the left side of thecal sac which appears to be compressing the left L5 nerve root in the lateral recess suspicious for a recurrent disc protrusion (particularly on the sagittal images). Epidural fibrosis is felt to be less likely. The exiting L4 nerve root appears normal. No spinal stenosis.  L5-S1: Shallow central and left paracentral disc protrusion comes close to contacting the left S1 nerve root. This appears stable. No foraminal stenosis.  IMPRESSION: 1. Postoperative changes at L4-5 with findings suspicious for a recurrent left paracentral disc protrusion with mass effect on the left side of the thecal sac and on the left L5 nerve root. 2. Shallow left foraminal/extraforaminal disc  protrusion at L2-3. 3. Stable small annular fissure and mild bulging annulus at L3-4. 4. Shallow central and left paracentral disc protrusion at L5-S1.   Electronically Signed   By: Rudie Meyer M.D.   On: 06/04/2018 10:26     Objective:  VS:  HT:    WT:   BMI:     BP:118/73  HR:85bpm  TEMP:97.7 F (36.5 C)( )  RESP:  Physical Exam  Ortho Exam Imaging: No results found.

## 2018-10-31 ENCOUNTER — Other Ambulatory Visit: Payer: Self-pay | Admitting: Internal Medicine

## 2018-10-31 DIAGNOSIS — K7010 Alcoholic hepatitis without ascites: Secondary | ICD-10-CM

## 2018-11-03 ENCOUNTER — Telehealth: Payer: Self-pay | Admitting: Internal Medicine

## 2018-11-03 NOTE — Telephone Encounter (Signed)
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  He was confused in the hospital but that again could have been from alcohol withdrawal.  Since we do not know, I would still recommend he be on something empirically for hepatic encephalopathy.  If the Xifaxan is cost prohibitive then please ask him to at least take lactulose.  Dose should be titrated to 2-3 loose bowel movements a day. Thanks ----- Message ----- From: Marcine Matar, MD Sent: 10/23/2018  11:04 AM EST To: Meredith Pel, NP Subject: Question                                       I am the PCP for this pt.  He continues to improve and LFTs have almost normalized.  The Rifaximin is cost prohibitive for him.  Does he need to continue taking?

## 2019-01-12 ENCOUNTER — Other Ambulatory Visit: Payer: Self-pay | Admitting: Internal Medicine

## 2019-01-12 DIAGNOSIS — F32A Depression, unspecified: Secondary | ICD-10-CM

## 2019-01-12 DIAGNOSIS — K7010 Alcoholic hepatitis without ascites: Secondary | ICD-10-CM

## 2019-01-12 DIAGNOSIS — F329 Major depressive disorder, single episode, unspecified: Secondary | ICD-10-CM

## 2019-01-12 DIAGNOSIS — F419 Anxiety disorder, unspecified: Secondary | ICD-10-CM

## 2019-03-09 ENCOUNTER — Telehealth: Payer: Self-pay

## 2019-03-09 NOTE — Telephone Encounter (Signed)
As per Javier Grimes, [employer] [place], the patient's referral is now closed.  

## 2019-09-27 ENCOUNTER — Encounter: Payer: Self-pay | Admitting: Internal Medicine

## 2019-09-27 NOTE — Progress Notes (Signed)
I have received a request for records from [facility] in [place].  They request his records from 04/19/2017 to present.  They also requested a statement based on medical findings expressing my opinion about the clients ability to do work related physical activities.

## 2020-01-19 IMAGING — CT CT HEAD W/O CM
3 series · 16 of 47 positions shown, 19 images · non-contrast
Comparison: 05/01/2018

CLINICAL DATA: Intermittent confusion

EXAM:
CT HEAD WITHOUT CONTRAST
TECHNIQUE: Contiguous axial images were obtained from the base of the skull
through the vertex without intravenous contrast.

[Series 2: head wo · axial · 0.48mm/px · z∈[-8,+117]mm · 10 of 31 slices shown, 13 images]
[im 3/31  brain]
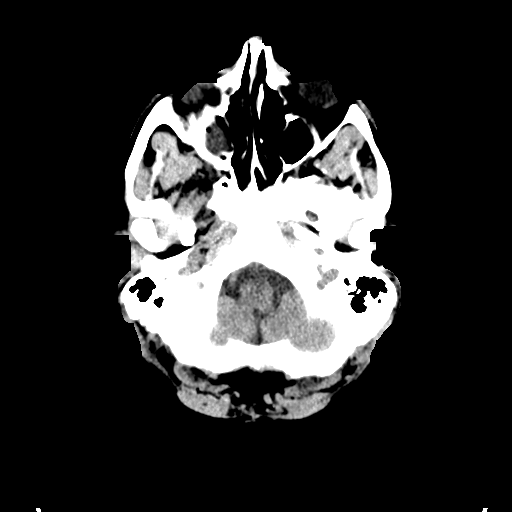
[im 3/31  bone]
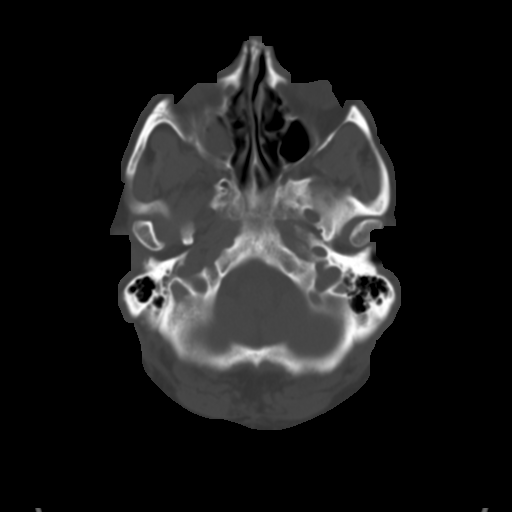
[im 6/31  brain]
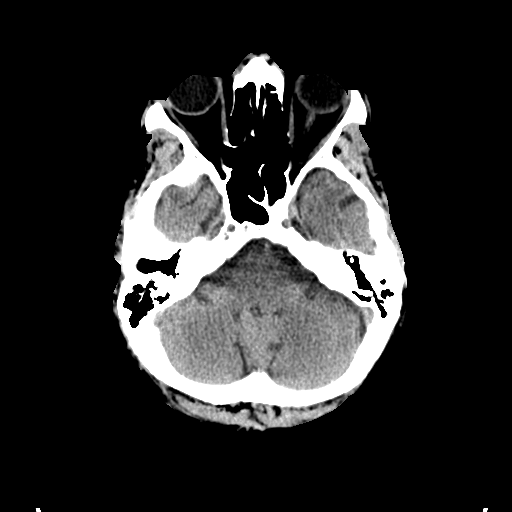
[im 9/31  brain]
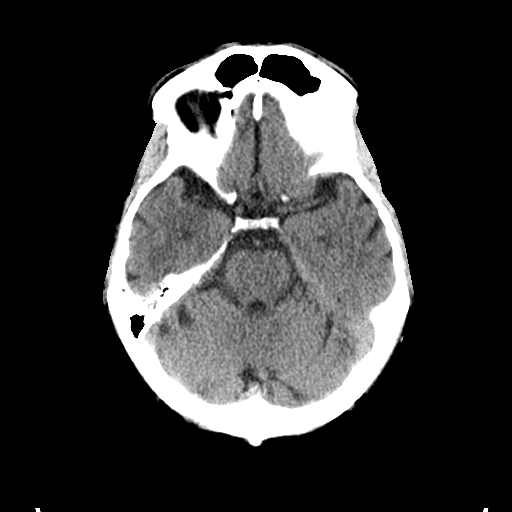
[im 11/31  brain]
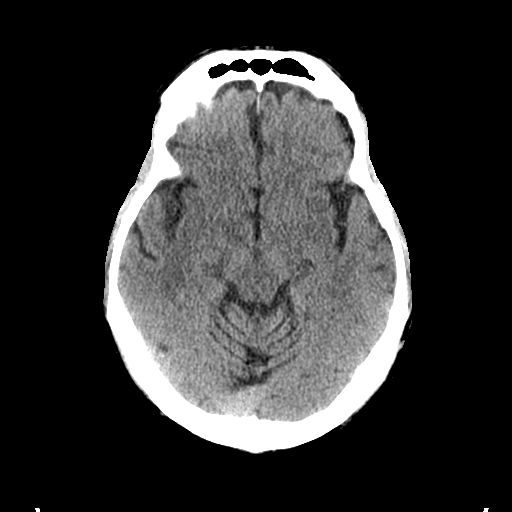
[im 14/31  brain]
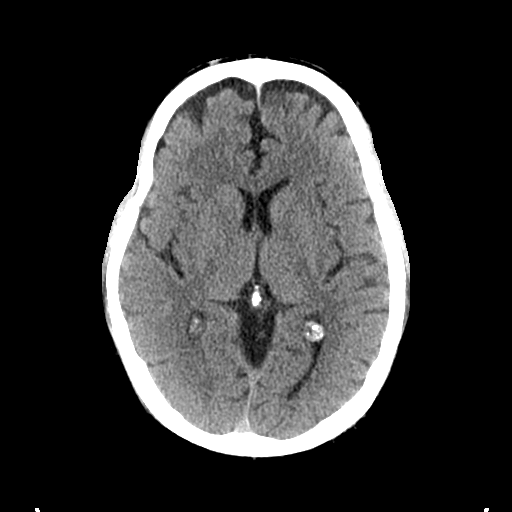
[im 14/31  bone]
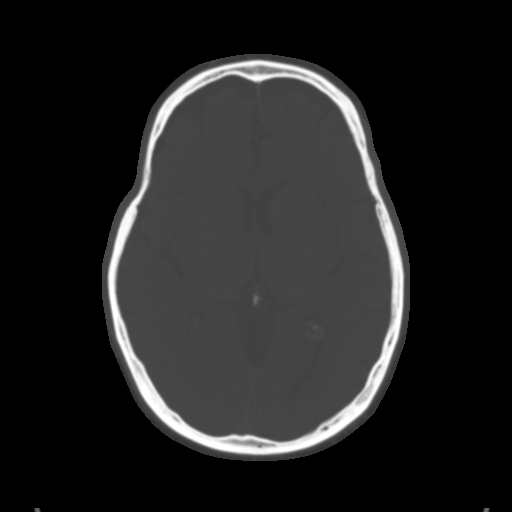
[im 17/31  brain]
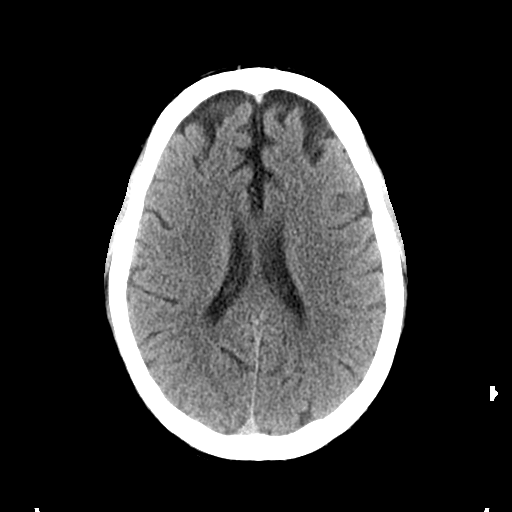
[im 20/31  brain]
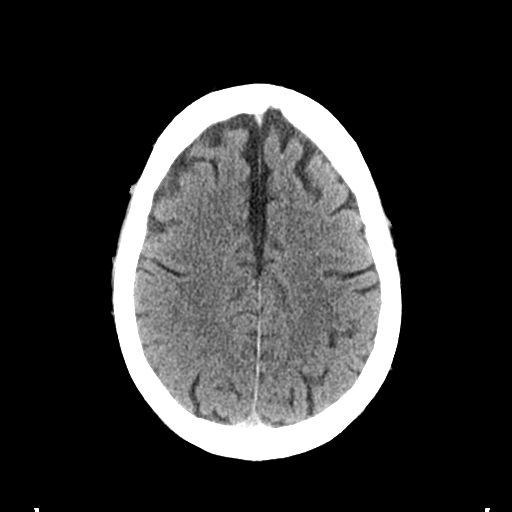
[im 23/31  brain]
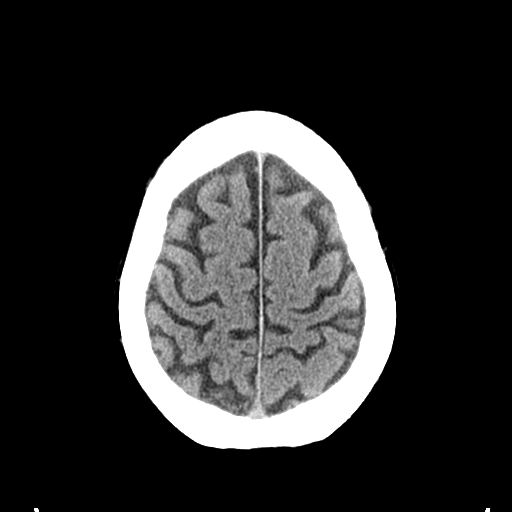
[im 25/31  brain]
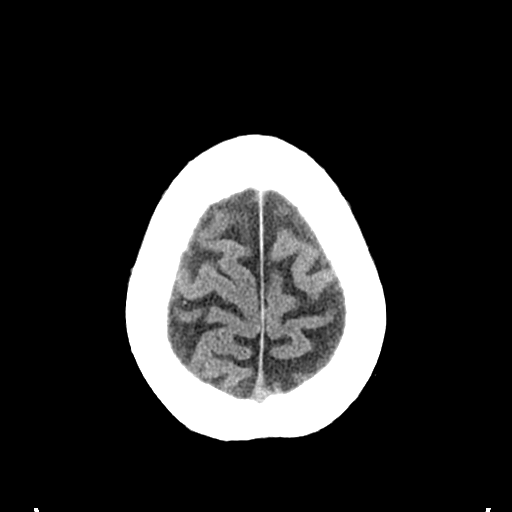
[im 25/31  bone]
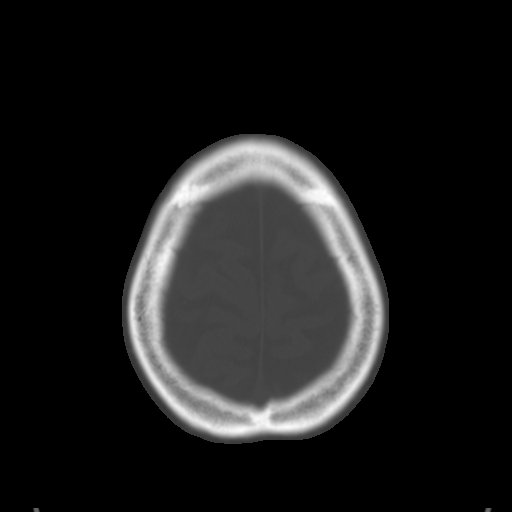
[im 28/31  brain]
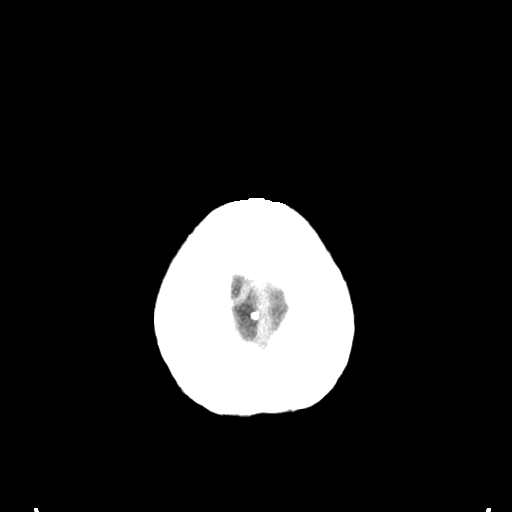

[Series 5: coronal soft tissue · coronal · 0.31mm/px · 3 of 69 slices shown]
[im 23/69  brain]
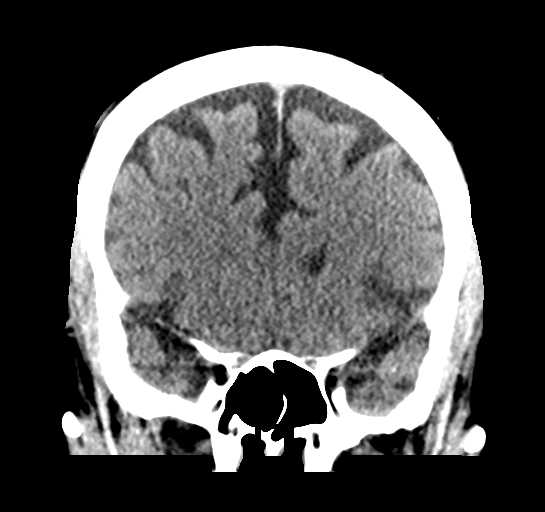
[im 31/69  brain]
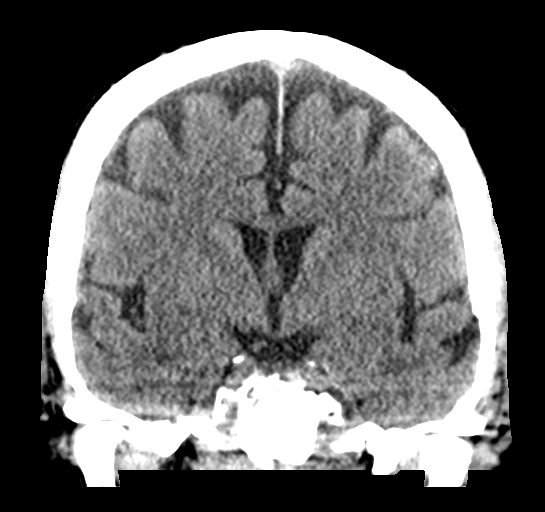
[im 38/69  brain]
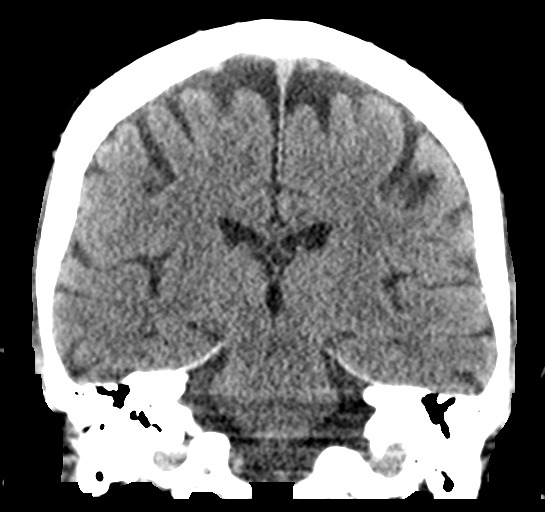

[Series 6: sagittal soft tissue · sagittal · 0.32mm/px · 3 of 54 slices shown]
[im 18/54  brain]
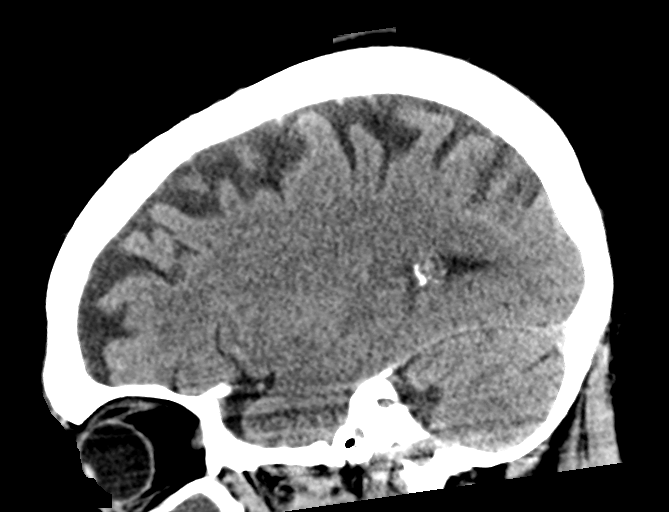
[im 27/54  brain]
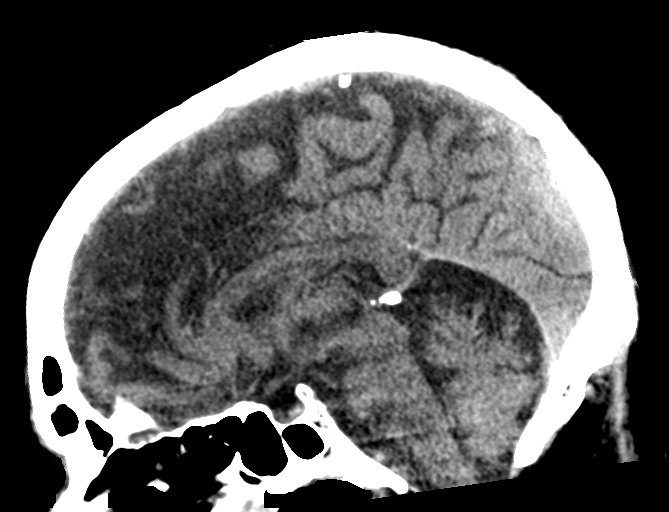
[im 36/54  brain]
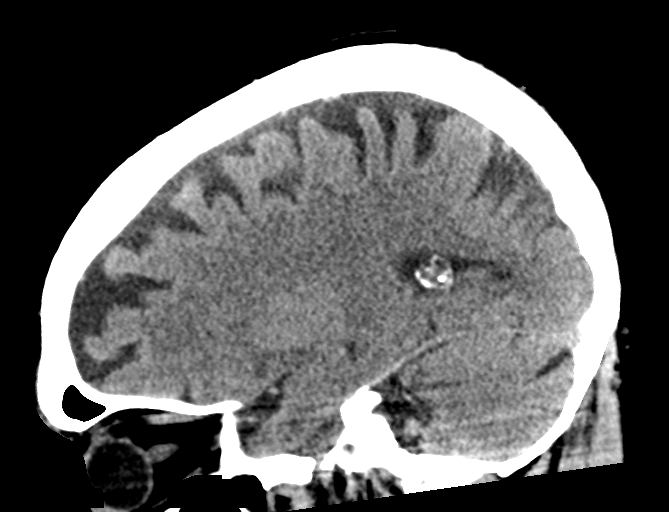

[16 of 47 positions shown; findings below may reference images not displayed]

FINDINGS: Brain: Generalized atrophic changes are noted. No findings to
suggest acute hemorrhage, acute infarction or space-occupying mass
lesion are noted.

Vascular: No hyperdense vessel or unexpected calcification.

Skull: Normal. Negative for fracture or focal lesion.

Sinuses/Orbits: Opacification of the right maxillary antrum is noted
which is chronic in nature. There is improved aeration of the left
maxillary antrum when compared with the prior exam.

Other: Stable opacification the right maxillary antrum. Improved
aeration on the left is noted.

Atrophic changes without acute intracranial abnormality.

## 2020-02-18 DEATH — deceased

## 2020-07-16 ENCOUNTER — Encounter: Payer: Self-pay | Admitting: Gastroenterology
